# Patient Record
Sex: Female | Born: 2000 | Hispanic: Yes | Marital: Single | Smoking: Never smoker
Health system: Southern US, Community
[De-identification: ages and names within clinical notes are randomized; demographics above are authoritative.]

## PROBLEM LIST (undated history)

## (undated) DIAGNOSIS — Z789 Other specified health status: Secondary | ICD-10-CM

## (undated) HISTORY — DX: Other specified health status: Z78.9

---
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
## 2022-08-18 DIAGNOSIS — R87610 Atypical squamous cells of undetermined significance on cytologic smear of cervix (ASC-US): Secondary | ICD-10-CM | POA: Insufficient documentation

## 2024-01-21 ENCOUNTER — Other Ambulatory Visit: Payer: Self-pay | Admitting: *Deleted

## 2024-01-21 ENCOUNTER — Encounter: Payer: Self-pay | Admitting: *Deleted

## 2024-01-22 ENCOUNTER — Ambulatory Visit: Payer: Self-pay | Admitting: *Deleted

## 2024-01-22 ENCOUNTER — Other Ambulatory Visit (INDEPENDENT_AMBULATORY_CARE_PROVIDER_SITE_OTHER): Payer: Self-pay

## 2024-01-22 VITALS — BP 113/77 | HR 65 | Wt 130.6 lb

## 2024-01-22 DIAGNOSIS — Z3A12 12 weeks gestation of pregnancy: Secondary | ICD-10-CM | POA: Diagnosis not present

## 2024-01-22 DIAGNOSIS — Z3401 Encounter for supervision of normal first pregnancy, first trimester: Secondary | ICD-10-CM | POA: Diagnosis not present

## 2024-01-22 DIAGNOSIS — O3680X0 Pregnancy with inconclusive fetal viability, not applicable or unspecified: Secondary | ICD-10-CM | POA: Diagnosis not present

## 2024-01-22 DIAGNOSIS — Z3480 Encounter for supervision of other normal pregnancy, unspecified trimester: Secondary | ICD-10-CM

## 2024-01-22 NOTE — Patient Instructions (Signed)

## 2024-01-22 NOTE — Progress Notes (Signed)
 New OB Intake  I connected with Katelyn Sawyer  on 01/22/24 at  9:15 AM EDT by In Person Visit and verified that I am speaking with the correct person using two identifiers. Nurse is located at [facility] and pt is located at [place].  I discussed the limitations, risks, security and privacy concerns of performing an evaluation and management service by telephone and the availability of in person appointments. I also discussed with the patient that there may be a patient responsible charge related to this service. The patient expressed understanding and agreed to proceed.  I explained I am completing New OB Intake today. We discussed EDD of 08/02/24 based on LMP of 10/27/23. Pt is G1P0. I reviewed her allergies, medications and Medical/Surgical/OB history.    Patient Active Problem List   Diagnosis Date Noted   Rhinitis, allergic 10/06/2014     Concerns addressed today  Delivery Plans Plans to deliver at [facility]. Discussed the nature of our practice with multiple providers including residents and students. Due to the size of the practice, the delivering provider may not be the same as those providing prenatal care.   Patient is interested in water birth.  MyChart/Babyscripts MyChart access verified. I explained pt will have some visits in office and some virtually. Babyscripts instructions given and order placed. Patient verifies receipt of registration text/e-mail. Account successfully created and app downloaded. If patient is a candidate for Optimized scheduling, add to sticky note.   Blood Pressure Cuff/Weight Scale Pt has BP cuff at home. Explained after first prenatal appt pt will check weekly and document in Babyscripts. Patient does not have weight scale; patient may purchase if they desire to track weight weekly in Babyscripts.  Anatomy US  Explained first scheduled US  will be around 19 weeks. Anatomy US  scheduled for TBD at TBD.  Is patient a candidate for Babyscripts  Optimization? Yes, patient accepted    First visit review I reviewed new OB appt with patient. Explained pt will be seen by Jorene Moats, PA at first visit. Discussed [place] genetic screening with patient. Requests Panorama and Horizon.. Routine prenatal labs collected at today's visit.   Last Pap No results found for: DIAGPAP  Rocky CHRISTELLA Ober, RN 01/22/2024  9:30 AM

## 2024-01-23 LAB — CBC/D/PLT+RPR+RH+ABO+RUBIGG...
Antibody Screen: NEGATIVE
Basophils Absolute: 0.1 x10E3/uL (ref 0.0–0.2)
Basos: 1 %
EOS (ABSOLUTE): 0.1 x10E3/uL (ref 0.0–0.4)
Eos: 1 %
HCV Ab: NONREACTIVE
HIV Screen 4th Generation wRfx: NONREACTIVE
Hematocrit: 43 % (ref 34.0–46.6)
Hemoglobin: 14.1 g/dL (ref 11.1–15.9)
Hepatitis B Surface Ag: NEGATIVE
Immature Grans (Abs): 0.1 x10E3/uL (ref 0.0–0.1)
Immature Granulocytes: 1 %
Lymphocytes Absolute: 1.6 x10E3/uL (ref 0.7–3.1)
Lymphs: 22 %
MCH: 31.1 pg (ref 26.6–33.0)
MCHC: 32.8 g/dL (ref 31.5–35.7)
MCV: 95 fL (ref 79–97)
Monocytes Absolute: 0.4 x10E3/uL (ref 0.1–0.9)
Monocytes: 5 %
Neutrophils Absolute: 5.1 x10E3/uL (ref 1.4–7.0)
Neutrophils: 70 %
Platelets: 227 x10E3/uL (ref 150–450)
RBC: 4.54 x10E6/uL (ref 3.77–5.28)
RDW: 13.4 % (ref 11.7–15.4)
RPR Ser Ql: NONREACTIVE
Rh Factor: POSITIVE
Rubella Antibodies, IGG: 1.03 {index} (ref >0.99)
WBC: 7.3 x10E3/uL (ref 3.4–10.8)

## 2024-01-23 LAB — HEMOGLOBIN A1C
Est. average glucose Bld gHb Est-mCnc: 100 mg/dL
Hgb A1c MFr Bld: 5.1 % (ref 4.8–5.6)

## 2024-01-23 LAB — HCV INTERPRETATION

## 2024-01-25 LAB — CULTURE, OB URINE

## 2024-01-25 LAB — URINE CULTURE, OB REFLEX

## 2024-01-27 LAB — PANORAMA PRENATAL TEST FULL PANEL:PANORAMA TEST PLUS 5 ADDITIONAL MICRODELETIONS: FETAL FRACTION: 11.1

## 2024-01-28 ENCOUNTER — Ambulatory Visit: Payer: Self-pay | Admitting: Obstetrics and Gynecology

## 2024-01-28 DIAGNOSIS — Z3480 Encounter for supervision of other normal pregnancy, unspecified trimester: Secondary | ICD-10-CM

## 2024-01-30 LAB — HORIZON CUSTOM: REPORT SUMMARY: NEGATIVE

## 2024-02-05 ENCOUNTER — Other Ambulatory Visit (HOSPITAL_COMMUNITY)
Admission: RE | Admit: 2024-02-05 | Discharge: 2024-02-05 | Disposition: A | Discharge status: Home / Self Care | Source: Ambulatory Visit | Attending: Physician Assistant | Admitting: Physician Assistant

## 2024-02-05 ENCOUNTER — Encounter: Payer: Self-pay | Admitting: Physician Assistant

## 2024-02-05 ENCOUNTER — Ambulatory Visit (INDEPENDENT_AMBULATORY_CARE_PROVIDER_SITE_OTHER): Admitting: Physician Assistant

## 2024-02-05 VITALS — BP 116/75 | HR 82 | Wt 135.6 lb

## 2024-02-05 DIAGNOSIS — Z3A14 14 weeks gestation of pregnancy: Secondary | ICD-10-CM

## 2024-02-05 DIAGNOSIS — Z3480 Encounter for supervision of other normal pregnancy, unspecified trimester: Secondary | ICD-10-CM | POA: Diagnosis not present

## 2024-02-05 DIAGNOSIS — Z1331 Encounter for screening for depression: Secondary | ICD-10-CM

## 2024-02-05 MED ORDER — VITAFOL GUMMIES 3.33-0.333-34.8 MG PO CHEW: 1.0000 | CHEWABLE_TABLET | Freq: Every day | ORAL | 5 refills | Status: AC

## 2024-02-05 NOTE — Progress Notes (Addendum)
   PRENATAL VISIT NOTE  Subjective:  Katelyn Sawyer is a 23 y.o. G1P0000 at [redacted]w[redacted]d being seen today for ongoing prenatal care.  She is currently monitored for the following issues for this low-risk pregnancy and has Rhinitis, allergic and Supervision of other normal pregnancy, antepartum on their problem list.  Patient reports no complaints.  Contractions: Irritability. Vag. Bleeding: None.  Movement: Present. Denies leaking of fluid.   The following portions of the patient's history were reviewed and updated as appropriate: allergies, current medications, past family history, past medical history, past social history, past surgical history and problem list.   Objective:    Vitals:   02/05/24 0926  BP: 116/75  Pulse: 82  Weight: 135 lb 9.6 oz (61.5 kg)    Fetal Status:  Fetal Heart Rate (bpm): 140   Movement: Present    General: Alert, oriented and cooperative. Patient is in no acute distress.  Skin: Skin is warm and dry. No rash noted.   Cardiovascular: Normal heart rate noted  Respiratory: Normal respiratory effort, no problems with respiration noted  Abdomen: Soft, gravid, appropriate for gestational age.  Pain/Pressure: Present     Pelvic: Cervical exam deferred        Extremities: Normal range of motion.  Edema: Trace  Mental Status: Normal mood and affect. Normal behavior. Normal judgment and thought content.   Assessment and Plan:  Pregnancy: G1P0000 at [redacted]w[redacted]d  1. Supervision of other normal pregnancy, antepartum (Primary) Initial labs drawn. Continue prenatal vitamins. Genetic Screening discussed: NIPS, carrier screening and AFP Ultrasound discussed; fetal anatomic survey: ordered Problem list reviewed and updated. Reviewed Brx optimized schedule, patient agreeable The nature of [facility] Faculty Practice with multiple MDs and other Advanced Practice Providers was explained to patient; also emphasized that residents, students are part of  our team. Routine obstetric precautions reviewed.   2. [redacted] weeks gestation of pregnancy Anticipatory guidance about next visits/weeks of pregnancy given.   3. Positive depression screening On no medication. No SI/HI or thoughts of self-harm. Open to discuss with [facility] BH. Shared [facility] center contact in AVS.  - Amb ref to Integrated Behavioral Health   Preterm labor symptoms and general obstetric precautions including but not limited to vaginal bleeding, contractions, leaking of fluid and fetal movement were reviewed in detail with the patient.  Please refer to After Visit Summary for other counseling recommendations.   No follow-ups on file.  No future appointments.   Orby Tangen E Yehia Mcbain, PA-C

## 2024-02-05 NOTE — Patient Instructions (Addendum)
 If you need urgent care for mental health, go to:   [facility]  Address: [address], [place], [place] [address] Phone: [phone] Hours: Open 24 hours with no appointment needed

## 2024-02-05 NOTE — Progress Notes (Signed)
 PHQ is 10; Gad is 5; pt made aware of referral and agreed to it.   Pt declines PAP today.

## 2024-02-06 LAB — CERVICOVAGINAL ANCILLARY ONLY
Chlamydia: NEGATIVE
Comment: NEGATIVE
Comment: NEGATIVE
Comment: NORMAL
Neisseria Gonorrhea: NEGATIVE
Trichomonas: NEGATIVE

## 2024-02-09 ENCOUNTER — Ambulatory Visit: Payer: Self-pay | Admitting: Physician Assistant

## 2024-03-04 ENCOUNTER — Ambulatory Visit (INDEPENDENT_AMBULATORY_CARE_PROVIDER_SITE_OTHER): Admitting: Physician Assistant

## 2024-03-04 VITALS — BP 110/62 | HR 80 | Wt 136.4 lb

## 2024-03-04 DIAGNOSIS — Z3A18 18 weeks gestation of pregnancy: Secondary | ICD-10-CM | POA: Diagnosis not present

## 2024-03-04 DIAGNOSIS — Z3480 Encounter for supervision of other normal pregnancy, unspecified trimester: Secondary | ICD-10-CM

## 2024-03-04 NOTE — Progress Notes (Signed)
 Pt presents for rob. Pt has no questions or concerns at this time.

## 2024-03-04 NOTE — Progress Notes (Signed)
   PRENATAL VISIT NOTE  Subjective:  Katelyn Sawyer is a 23 y.o. G1P0000 at [redacted]w[redacted]d being seen today for ongoing prenatal care.  She is currently monitored for the following issues for this low-risk pregnancy and has Rhinitis, allergic and Supervision of other normal pregnancy, antepartum on their problem list.  Patient reports no complaints.  Contractions: Not present. Vag. Bleeding: None.  Movement: Absent. Denies leaking of fluid.   The following portions of the patient's history were reviewed and updated as appropriate: allergies, current medications, past family history, past medical history, past social history, past surgical history and problem list.   Objective:    Vitals:   03/04/24 1026  BP: 110/62  Pulse: 80  Weight: 136 lb 6.4 oz (61.9 kg)    Fetal Status:  Fetal Heart Rate (bpm): 140 Fundal Height: 19 cm Movement: Absent    General: Alert, oriented and cooperative. Patient is in no acute distress.  Skin: Skin is warm and dry. No rash noted.   Cardiovascular: Normal heart rate noted  Respiratory: Normal respiratory effort, no problems with respiration noted  Abdomen: Soft, gravid, appropriate for gestational age.  Pain/Pressure: Present     Pelvic: Cervical exam deferred        Extremities: Normal range of motion.  Edema: None  Mental Status: Normal mood and affect. Normal behavior. Normal judgment and thought content.   Assessment and Plan:  Pregnancy: G1P0000 at [redacted]w[redacted]d  1. Supervision of other normal pregnancy, antepartum (Primary) Patient doing well, feeling regular fetal movement  BP, FHR, FH appropriate   2. [redacted] weeks gestation of pregnancy Anticipatory guidance about next visits/weeks of pregnancy given.   Preterm labor symptoms and general obstetric precautions including but not limited to vaginal bleeding, contractions, leaking of fluid and fetal movement were reviewed in detail with the patient.  Please refer to After Visit Summary for other  counseling recommendations.   Return in about 4 weeks (around 04/01/2024) for LOB.  No future appointments.   Molli Gethers E Joia Doyle, PA-C

## 2024-03-06 LAB — AFP, SERUM, OPEN SPINA BIFIDA
AFP MoM: 1.43
AFP Value: 65.8 ng/mL
Gest. Age on Collection Date: 18 wk
Maternal Age At EDD: 23 a
OSBR Risk 1 IN: 3311
Test Results:: NEGATIVE
Weight: 136 [lb_av]

## 2024-03-11 ENCOUNTER — Ambulatory Visit: Payer: Self-pay | Admitting: Physician Assistant

## 2024-03-17 ENCOUNTER — Ambulatory Visit

## 2024-03-17 ENCOUNTER — Other Ambulatory Visit (HOSPITAL_COMMUNITY)
Admission: RE | Admit: 2024-03-17 | Discharge: 2024-03-17 | Disposition: A | Discharge status: Home / Self Care | Source: Ambulatory Visit | Attending: Obstetrics and Gynecology | Admitting: Obstetrics and Gynecology

## 2024-03-17 VITALS — BP 104/65 | HR 75

## 2024-03-17 DIAGNOSIS — R3915 Urgency of urination: Secondary | ICD-10-CM

## 2024-03-17 DIAGNOSIS — N898 Other specified noninflammatory disorders of vagina: Secondary | ICD-10-CM

## 2024-03-17 LAB — POCT URINALYSIS DIPSTICK
Bilirubin, UA: NEGATIVE
Blood, UA: NEGATIVE
Glucose, UA: NEGATIVE
Ketones, UA: NEGATIVE
Leukocytes, UA: NEGATIVE
Nitrite, UA: NEGATIVE
Protein, UA: POSITIVE — AB
Spec Grav, UA: 1.02 (ref 1.010–1.025)
Urobilinogen, UA: 0.2 U/dL
pH, UA: 6.5 (ref 5.0–8.0)

## 2024-03-17 MED ORDER — NITROFURANTOIN MONOHYD MACRO 100 MG PO CAPS: 100.0000 mg | ORAL_CAPSULE | Freq: Two times a day (BID) | ORAL | 0 refills | Status: DC

## 2024-03-17 NOTE — Progress Notes (Signed)
 SUBJECTIVE: Katelyn Sawyer is a 23 y.o. female who complains of urinary urgency OSD <1 week, without flank pain, fever, chills, or bleeding. Pt reports feeling the urge to urinate but then is unable to go. She also reports creamy Maylani Embree discharge. She was previously experiencing some itching but that has since resolved.   OBJECTIVE: Appears well, in no apparent distress.  Vital signs are normal. Urine dipstick shows positive for protein.    ASSESSMENT: Urinary urgency Vaginal Discharge  PLAN: Treatment per protocol. Urine culture sent to lab. Self swab obtained related to vaginal discharge. Call or return to clinic prn if these symptoms worsen or fail to improve as anticipated.

## 2024-03-18 LAB — CERVICOVAGINAL ANCILLARY ONLY
Bacterial Vaginitis (gardnerella): NEGATIVE
Candida Glabrata: NEGATIVE
Candida Vaginitis: NEGATIVE
Chlamydia: NEGATIVE
Comment: NEGATIVE
Comment: NEGATIVE
Comment: NEGATIVE
Comment: NEGATIVE
Comment: NEGATIVE
Comment: NORMAL
Neisseria Gonorrhea: NEGATIVE
Trichomonas: NEGATIVE

## 2024-03-21 LAB — URINE CULTURE

## 2024-04-01 ENCOUNTER — Encounter: Payer: Self-pay | Admitting: Physician Assistant

## 2024-04-01 ENCOUNTER — Ambulatory Visit (INDEPENDENT_AMBULATORY_CARE_PROVIDER_SITE_OTHER): Admitting: Physician Assistant

## 2024-04-01 VITALS — BP 115/70 | HR 94 | Wt 146.0 lb

## 2024-04-01 DIAGNOSIS — Z3A22 22 weeks gestation of pregnancy: Secondary | ICD-10-CM

## 2024-04-01 DIAGNOSIS — K30 Functional dyspepsia: Secondary | ICD-10-CM | POA: Diagnosis not present

## 2024-04-01 DIAGNOSIS — Z3480 Encounter for supervision of other normal pregnancy, unspecified trimester: Secondary | ICD-10-CM | POA: Diagnosis not present

## 2024-04-01 NOTE — Progress Notes (Signed)
   PRENATAL VISIT NOTE  Subjective:  Katelyn Sawyer is a 23 y.o. G1P0000 at [redacted]w[redacted]d being seen today for ongoing prenatal care.  She is currently monitored for the following issues for this low-risk pregnancy and has Supervision of other normal pregnancy, antepartum on their problem list.  Patient reports stabbing pain in upper middle abdomen that began earlier today. No fever, n/v, diarrhea, constipation.   Contractions: Irritability. Vag. Bleeding: None.  Movement: Present. Denies leaking of fluid.   The following portions of the patient's history were reviewed and updated as appropriate: allergies, current medications, past family history, past medical history, past social history, past surgical history and problem list.   Objective:    Vitals:   04/01/24 1354  BP: 115/70  Pulse: 94  Weight: 146 lb (66.2 kg)    Fetal Status:  Fetal Heart Rate (bpm): 145 Fundal Height: 22 cm Movement: Present    General: Alert, oriented and cooperative. Patient is in no acute distress.  Skin: Skin is warm and dry. No rash noted.   Cardiovascular: Normal heart rate noted  Respiratory: Normal respiratory effort, no problems with respiration noted  Abdomen: Soft, gravid, appropriate for gestational age.  Pain/Pressure: Present     Pelvic: Cervical exam deferred        Extremities: Normal range of motion.  Edema: None  Mental Status: Normal mood and affect. Normal behavior. Normal judgment and thought content.   Assessment and Plan:  Pregnancy: G1P0000 at [redacted]w[redacted]d  1. Supervision of other normal pregnancy, antepartum (Primary) Patient doing well, feeling regular fetal movement  BP, FHR, FH appropriate   2. [redacted] weeks gestation of pregnancy Anticipatory guidance about next visits/weeks of pregnancy given.   3. Indigestion Patient amenable to Pepcid trial OTC. Precautions given regarding presenting for emergency care.    Preterm labor symptoms and general obstetric precautions including  but not limited to vaginal bleeding, contractions, leaking of fluid and fetal movement were reviewed in detail with the patient.  Please refer to After Visit Summary for other counseling recommendations.   No follow-ups on file.  Future Appointments  Date Time Provider Department Center  04/10/2024  2:00 PM [facility] PROVIDER 1 [facility]  04/10/2024  2:30 PM [facility] US4 [facility]  04/29/2024  8:55 AM Latriece Anstine E, PA-C [facility] None    Abran Gavigan E Debora Stockdale, PA-C

## 2024-04-01 NOTE — Progress Notes (Signed)
 Pt states she just began having some stomach pain today that is concerning to her.   Pt states she never took the Microbid that was prescribed on 03/17/24  Urine was positive for protein on 9/9 labs.

## 2024-04-01 NOTE — Patient Instructions (Addendum)
 Foods that may cause indigestion or heartburn: -Fatty and fried foods, which linger longer in the stomach, making it more likely that stomach acid leaks back up into the esophagus, causing uncomfortable GERD symptoms. -Spicy foods, citrus, tomato sauces, and vinegar, which may intensify heartburn. -Chocolate, caffeine, onions, peppermint, carbonated drinks, and alcohol, which are all common heartburn triggers.  If you eat any of these foods regularly, try eliminating them to see if doing so reduces your reflux. Then you can try adding them back one by one.

## 2024-04-10 ENCOUNTER — Ambulatory Visit: Attending: Obstetrics and Gynecology | Admitting: Obstetrics

## 2024-04-10 ENCOUNTER — Ambulatory Visit (HOSPITAL_BASED_OUTPATIENT_CLINIC_OR_DEPARTMENT_OTHER)

## 2024-04-10 ENCOUNTER — Other Ambulatory Visit: Payer: Self-pay | Admitting: *Deleted

## 2024-04-10 VITALS — BP 111/61 | HR 80

## 2024-04-10 DIAGNOSIS — Z3689 Encounter for other specified antenatal screening: Secondary | ICD-10-CM | POA: Diagnosis not present

## 2024-04-10 DIAGNOSIS — Z3A23 23 weeks gestation of pregnancy: Secondary | ICD-10-CM | POA: Diagnosis not present

## 2024-04-10 DIAGNOSIS — Z363 Encounter for antenatal screening for malformations: Secondary | ICD-10-CM | POA: Diagnosis present

## 2024-04-10 DIAGNOSIS — Z3480 Encounter for supervision of other normal pregnancy, unspecified trimester: Secondary | ICD-10-CM

## 2024-04-10 DIAGNOSIS — O358XX0 Maternal care for other (suspected) fetal abnormality and damage, not applicable or unspecified: Secondary | ICD-10-CM

## 2024-04-10 DIAGNOSIS — Z3492 Encounter for supervision of normal pregnancy, unspecified, second trimester: Secondary | ICD-10-CM

## 2024-04-10 NOTE — Progress Notes (Signed)
 MFM Consult Note  Katelyn Sawyer is currently at 23 weeks and 5 days.  She was seen for detailed fetal anatomy scan.  She denies any significant past medical history and denies any problems in her current pregnancy.    She had a cell free DNA test earlier in her pregnancy which indicated a low risk for trisomy 22, 67, and 13. A female fetus is predicted.   Sonographic findings Single intrauterine pregnancy at 23w 3d. Fetal cardiac activity:  Observed and appears normal. Presentation: Cephalic. The anatomic structures that were well seen appear normal without evidence of soft markers. The anatomic survey is complete.  Fetal biometry shows the estimated fetal weight at the 44th percentile. Amniotic fluid: Within normal limits.  MVP: 5.78 cm. Placenta: Anterior. Adnexa: No abnormality visualized. Cervical length: 4.6 cm.  Based on the fetal biometry measurements obtained today, her EDC was changed to August 04, 2024, making her 23 weeks and 3 days pregnant today.    The [facility] of August 04, 2024 is consistent with a first trimester ultrasound performed in your office.  The patient was informed that anomalies may be missed due to technical limitations. If the fetus is in a suboptimal position or maternal habitus is increased, visualization of the fetus in the maternal uterus may be impaired.  A follow-up exam was scheduled in 6 weeks to confirm her dates.    The patient stated that all of her questions were answered today.  A total of 30 minutes was spent counseling and coordinating the care for this patient.  Greater than 50% of the time was spent in direct face-to-face contact.

## 2024-04-29 ENCOUNTER — Encounter: Admitting: Physician Assistant

## 2024-05-05 ENCOUNTER — Ambulatory Visit (INDEPENDENT_AMBULATORY_CARE_PROVIDER_SITE_OTHER): Admitting: Obstetrics and Gynecology

## 2024-05-05 VITALS — BP 120/71 | HR 67 | Wt 153.0 lb

## 2024-05-05 DIAGNOSIS — Z3A27 27 weeks gestation of pregnancy: Secondary | ICD-10-CM | POA: Diagnosis not present

## 2024-05-05 DIAGNOSIS — Z23 Encounter for immunization: Secondary | ICD-10-CM | POA: Diagnosis not present

## 2024-05-05 DIAGNOSIS — Z3480 Encounter for supervision of other normal pregnancy, unspecified trimester: Secondary | ICD-10-CM | POA: Diagnosis not present

## 2024-05-05 NOTE — Progress Notes (Signed)
   PRENATAL VISIT NOTE  Subjective:  Katelyn Sawyer is a 23 y.o. G1P0000 at [redacted]w[redacted]d being seen today for ongoing prenatal care.  She is currently monitored for the following issues for this low-risk pregnancy and has Supervision of other normal pregnancy, antepartum on their problem list.  Patient doing well with no acute concerns today. She reports no complaints.  Contractions: Not present. Vag. Bleeding: None.  Movement: Present. Denies leaking of fluid.   The following portions of the patient's history were reviewed and updated as appropriate: allergies, current medications, past family history, past medical history, past social history, past surgical history and problem list. Problem list updated.  Objective:   Vitals:   05/05/24 1117  BP: 120/71  Pulse: 67  Weight: 153 lb (69.4 kg)    Fetal Status: Fetal Heart Rate (bpm): 138 Fundal Height: 28 cm Movement: Present     General:  Alert, oriented and cooperative. Patient is in no acute distress.  Skin: Skin is warm and dry. No rash noted.   Cardiovascular: Normal heart rate noted  Respiratory: Normal respiratory effort, no problems with respiration noted  Abdomen: Soft, gravid, appropriate for gestational age.  Pain/Pressure: Absent     Pelvic: Cervical exam deferred        Extremities: Normal range of motion.  Edema: None  Mental Status:  Normal mood and affect. Normal behavior. Normal judgment and thought content.   Assessment and Plan:  Pregnancy: G1P0000 at [redacted]w[redacted]d  1. Supervision of other normal pregnancy, antepartum (Primary) Continue routine prenatal care Reviewed immunization record, no current flu shot, will offer at next visit - Tdap vaccine greater than or equal to 7yo IM  2. [redacted] weeks gestation of pregnancy   Preterm labor symptoms and general obstetric precautions including but not limited to vaginal bleeding, contractions, leaking of fluid and fetal movement were reviewed in detail with the  patient.  Please refer to After Visit Summary for other counseling recommendations.   Return in about 2 weeks (around 05/19/2024) for ROB, in person, 2 hr GTT, 3rd trim labs.   Jerilynn Buddle, MD Faculty Attending Center for [facility]

## 2024-05-15 ENCOUNTER — Ambulatory Visit

## 2024-05-19 ENCOUNTER — Ambulatory Visit: Admitting: Obstetrics and Gynecology

## 2024-05-19 ENCOUNTER — Other Ambulatory Visit

## 2024-05-19 VITALS — BP 107/70 | HR 80 | Wt 154.0 lb

## 2024-05-19 DIAGNOSIS — Z23 Encounter for immunization: Secondary | ICD-10-CM

## 2024-05-19 DIAGNOSIS — Z3A29 29 weeks gestation of pregnancy: Secondary | ICD-10-CM

## 2024-05-19 DIAGNOSIS — Z3480 Encounter for supervision of other normal pregnancy, unspecified trimester: Secondary | ICD-10-CM

## 2024-05-19 DIAGNOSIS — Z3401 Encounter for supervision of normal first pregnancy, first trimester: Secondary | ICD-10-CM | POA: Diagnosis not present

## 2024-05-19 NOTE — Progress Notes (Signed)
   PRENATAL VISIT NOTE  Subjective:  Katelyn Sawyer is a 23 y.o. G1P0000 at [redacted]w[redacted]d being seen today for ongoing prenatal care.  She is currently monitored for the following issues for this low-risk pregnancy and has Supervision of other normal pregnancy, antepartum on their problem list.  Patient doing well with no acute concerns today. She reports no complaints.  Contractions: Irritability. Vag. Bleeding: None.  Movement: Present. Denies leaking of fluid.   The following portions of the patient's history were reviewed and updated as appropriate: allergies, current medications, past family history, past medical history, past social history, past surgical history and problem list. Problem list updated.  Objective:   Vitals:   05/19/24 1009  BP: 107/70  Pulse: 80  Weight: 154 lb (69.9 kg)    Fetal Status: Fetal Heart Rate (bpm): 140 Fundal Height: 29 cm Movement: Present     General:  Alert, oriented and cooperative. Patient is in no acute distress.  Skin: Skin is warm and dry. No rash noted.   Cardiovascular: Normal heart rate noted  Respiratory: Normal respiratory effort, no problems with respiration noted  Abdomen: Soft, gravid, appropriate for gestational age.  Pain/Pressure: Absent     Pelvic: Cervical exam deferred        Extremities: Normal range of motion.  Edema: None  Mental Status:  Normal mood and affect. Normal behavior. Normal judgment and thought content.   Assessment and Plan:  Pregnancy: G1P0000 at [redacted]w[redacted]d  1. Supervision of other normal pregnancy, antepartum (Primary) Continue routine prenatal care  - Glucose Tolerance, 2 Hours w/1 Hour - RPR - CBC - HIV antibody (with reflex) - Flu vaccine trivalent PF, 6mos and older(Flulaval,Afluria,Fluarix,Fluzone)  2. [redacted] weeks gestation of pregnancy  - Glucose Tolerance, 2 Hours w/1 Hour - RPR - CBC - HIV antibody (with reflex) - Flu vaccine trivalent PF, 6mos and  older(Flulaval,Afluria,Fluarix,Fluzone)  Preterm labor symptoms and general obstetric precautions including but not limited to vaginal bleeding, contractions, leaking of fluid and fetal movement were reviewed in detail with the patient.  Please refer to After Visit Summary for other counseling recommendations.   Return in about 2 weeks (around 06/02/2024) for LOB, in person.   Jerilynn Buddle, MD Faculty Attending Center for [facility]

## 2024-05-20 LAB — CBC
Hematocrit: 36.9 % (ref 34.0–46.6)
Hemoglobin: 12.2 g/dL (ref 11.1–15.9)
MCH: 31 pg (ref 26.6–33.0)
MCHC: 33.1 g/dL (ref 31.5–35.7)
MCV: 94 fL (ref 79–97)
Platelets: 211 x10E3/uL (ref 150–450)
RBC: 3.94 x10E6/uL (ref 3.77–5.28)
RDW: 12.8 % (ref 11.7–15.4)
WBC: 7.8 x10E3/uL (ref 3.4–10.8)

## 2024-05-20 LAB — GLUCOSE TOLERANCE, 2 HOURS W/ 1HR
Glucose, 1 hour: 119 mg/dL (ref 70–179)
Glucose, 2 hour: 84 mg/dL (ref 70–152)
Glucose, Fasting: 72 mg/dL (ref 70–91)

## 2024-05-20 LAB — RPR: RPR Ser Ql: NONREACTIVE

## 2024-05-20 LAB — HIV ANTIBODY (ROUTINE TESTING W REFLEX): HIV Screen 4th Generation wRfx: NONREACTIVE

## 2024-05-27 ENCOUNTER — Ambulatory Visit: Payer: Self-pay | Admitting: Obstetrics and Gynecology

## 2024-06-02 ENCOUNTER — Encounter: Admitting: Obstetrics

## 2024-06-11 ENCOUNTER — Ambulatory Visit: Admitting: Obstetrics and Gynecology

## 2024-06-11 VITALS — BP 118/68 | HR 75 | Wt 160.1 lb

## 2024-06-11 DIAGNOSIS — Z3A32 32 weeks gestation of pregnancy: Secondary | ICD-10-CM

## 2024-06-11 DIAGNOSIS — Z3480 Encounter for supervision of other normal pregnancy, unspecified trimester: Secondary | ICD-10-CM

## 2024-06-11 DIAGNOSIS — Z3403 Encounter for supervision of normal first pregnancy, third trimester: Secondary | ICD-10-CM

## 2024-06-11 NOTE — Progress Notes (Signed)
 Denies any specific concerns. States she changed her mind about doing water birth classes.

## 2024-06-11 NOTE — Progress Notes (Signed)
   PRENATAL VISIT NOTE  Subjective:  Katelyn Sawyer is a 23 y.o. G1P0000 at [redacted]w[redacted]d being seen today for ongoing prenatal care.  She is currently monitored for the following issues for this low-risk pregnancy and has Supervision of other normal pregnancy, antepartum and Atypical squamous cells of undetermined significance (ASCUS) on Papanicolaou smear of cervix on their problem list.  Patient reports no complaints.  Contractions: Not present. Vag. Bleeding: None.  Movement: Present. Denies leaking of fluid.   The following portions of the patient's history were reviewed and updated as appropriate: allergies, current medications, past family history, past medical history, past social history, past surgical history and problem list.   Objective:   Vitals:   06/11/24 0906  BP: 118/68  Pulse: 75  Weight: 160 lb 1.6 oz (72.6 kg)    Fetal Status: Fetal Heart Rate (bpm): 139 Fundal Height: 31 cm Movement: Present     General:  Alert, oriented and cooperative. Patient is in no acute distress.  Skin: Skin is warm and dry. No rash noted.   Cardiovascular: Normal heart rate noted  Respiratory: Normal respiratory effort, no problems with respiration noted  Abdomen: Soft, gravid, appropriate for gestational age.  Pain/Pressure: Present      Assessment and Plan:  Pregnancy: G1P0000 at [redacted]w[redacted]d 1. Supervision of other normal pregnancy, antepartum (Primary) 2. [redacted] weeks gestation of pregnancy Peds list given Discussed RSV vaccine, not in stock this week, plan to admin next visit  Please refer to After Visit Summary for other counseling recommendations.   Return in about 4 weeks (around 07/09/2024) for return OB at 34 weeks with APP.  Kieth JAYSON Carolin, MD

## 2024-06-11 NOTE — Patient Instructions (Signed)

## 2024-06-18 ENCOUNTER — Ambulatory Visit: Attending: Obstetrics and Gynecology

## 2024-06-18 ENCOUNTER — Ambulatory Visit: Admitting: Maternal & Fetal Medicine

## 2024-06-18 VITALS — BP 120/67 | HR 74

## 2024-06-18 DIAGNOSIS — Z3493 Encounter for supervision of normal pregnancy, unspecified, third trimester: Secondary | ICD-10-CM

## 2024-06-18 DIAGNOSIS — Z3480 Encounter for supervision of other normal pregnancy, unspecified trimester: Secondary | ICD-10-CM

## 2024-06-18 DIAGNOSIS — Z3A33 33 weeks gestation of pregnancy: Secondary | ICD-10-CM

## 2024-06-18 DIAGNOSIS — Z3492 Encounter for supervision of normal pregnancy, unspecified, second trimester: Secondary | ICD-10-CM | POA: Insufficient documentation

## 2024-06-18 NOTE — Progress Notes (Signed)
° °  Patient information  Patient Name: Katelyn Sawyer  Patient MRN:   [id number]  Referring practice: [facility] Referring Provider: [facility]  Problem List   Patient Active Problem List   Diagnosis Date Noted   Supervision of other normal pregnancy, antepartum 01/22/2024   Atypical squamous cells of undetermined significance (ASCUS) on Papanicolaou smear of cervix 08/18/2022    Maternal Fetal medicine Consult  [facility] Katelyn Sawyer is a 23 y.o. G1P0000 at [redacted]w[redacted]d here for ultrasound and consultation. Katelyn Sawyer is doing well today with no acute concerns. Today we focused on the following:   Patient presents for a follow-up fetal growth ultrasound to confirm the estimated due date. Fetal growth is at the 20th percentile overall. The patient reports good fetal movement and has no current concerns. There are no additional indications for further ultrasounds at this time, and she may continue routine care with her OB provider. Referral back to MFM is appropriate if any new concerns arise.  Recommendations: -Continue routine prenatal care -Fetal movement precautions -No further ultrasounds needed unless new indications arise -Refer back to MFM with any concerns  Standard OB precautions were given to the patient. The patient had time to ask questions that were answered to her satisfaction.  She verbalized understanding and agrees to proceed with the plan above.   I spent 10 minutes reviewing the patients chart, including labs and images as well as counseling the patient about her medical conditions. Greater than 50% of the time was spent in direct face-to-face patient counseling.  Katelyn Sawyer  MFM, [place]   06/18/2024  11:29 AM   Review of Systems: A review of systems was performed and was negative except per HPI   Vitals and Physical Exam    06/18/2024    7:15 AM 06/11/2024    9:06 AM 05/19/2024   10:09 AM  Vitals with BMI  Weight  160 lbs 2  oz 154 lbs  Systolic 120 118 892  Diastolic 67 68 70  Pulse 74 75 80    Sitting comfortably on the sonogram table Nonlabored breathing Normal rate and rhythm Abdomen is nontender  Past pregnancies OB History  Gravida Para Term Preterm AB Living  1 0 0 0 0 0  SAB IAB Ectopic Multiple Live Births  0 0 0 0 0    # Outcome Date GA Lbr Len/2nd Weight Sex Type Anes PTL Lv  1 Current              Future Appointments  Date Time Provider Department Center  07/13/2024  8:55 AM Katelyn Nidia CROME, FNP [facility] None

## 2024-07-09 NOTE — L&D Delivery Note (Signed)
 [facility]N [facility] Delivery Note  Katelyn Sawyer is a 24 y.o. G1P0000 s/p NVD at [redacted]w[redacted]d. She was admitted for SROM.  ROM: 35h 45m with clear fluid GBS Status: Negative/-- (01/05 0935)  Maximum Maternal Temperature: 98.8  Labor Progress: Initial SVE: 3/80/-3. AROM and Pitocin . She then progressed to complete.   Delivery Date/Time: 08/10/2024 11:11 PM Delivery: Called to room and patient was complete and pushing. Head delivered straight occiput anterior. nuchal cord absent. Shoulder and body delivered in usual fashion. Infant with spontaneous cry, placed on mother's abdomen, dried and stimulated. Cord clamped x 2 after 1-minute delay, and cut by father. Cord blood collected . Placenta delivered spontaneously with gentle cord traction. Fundus firm with massage and Pitocin . Labia, perineum, vagina, and cervix were inspected and repaired.  Placenta:  spontaneous Intact Placenta to L&D Complications:none Lacerations: 1st degree perineal, R vaginal sidewall and labial QBL: Analgesia: Epidural  Newborn Data:  Living status:Living Gender:Female Apgars:8 ,9  Weight:            Barabara Maier, DO [facility] [employer]

## 2024-07-13 ENCOUNTER — Other Ambulatory Visit (HOSPITAL_COMMUNITY)
Admission: RE | Admit: 2024-07-13 | Discharge: 2024-07-13 | Disposition: A | Discharge status: Home / Self Care | Source: Ambulatory Visit | Attending: Obstetrics and Gynecology | Admitting: Obstetrics and Gynecology

## 2024-07-13 ENCOUNTER — Encounter: Admitting: Obstetrics and Gynecology

## 2024-07-13 ENCOUNTER — Encounter: Payer: Self-pay | Admitting: Obstetrics and Gynecology

## 2024-07-13 VITALS — BP 126/69 | HR 74 | Wt 166.8 lb

## 2024-07-13 DIAGNOSIS — Z3483 Encounter for supervision of other normal pregnancy, third trimester: Secondary | ICD-10-CM | POA: Insufficient documentation

## 2024-07-13 DIAGNOSIS — Z3480 Encounter for supervision of other normal pregnancy, unspecified trimester: Secondary | ICD-10-CM

## 2024-07-13 DIAGNOSIS — Z3A37 37 weeks gestation of pregnancy: Secondary | ICD-10-CM | POA: Insufficient documentation

## 2024-07-13 NOTE — Patient Instructions (Addendum)
 [facility] Pediatric Providers  [place]outheast [place] ([address]) [facility] Delores, MD; Jeanelle, MD; Anders, MD; Scarlet, MD; McDiarmid, MD; Debborah, MD [address] [place], [place] [address] [phone] Mon-Fri 8:30-12:30, 1:30-5:00  Providers come to see babies during newborn hospitalization Only accepting infants of Mother's who are seen at [facility] or have siblings seen at   [facility] Medicaid - Yes; Tricare - Yes   [facility] [place], MD [address], [place], [place] [address] [phone] Mon, Tue, Thur, Fri 8:30-5:00, Wed 10:00-7:00 (closed 1-2pm daily for lunch) [facility] residents with no insurance.  [place] [employer] only with Medicaid/insurance; Tricare - no  [facility]) - Tim [facility], MD; Delores, MD; Dozier, MD; Artice, MD; Lorrene, MD; Kenney, MD; Azell, MD; Joshua,  MD; Gretel, MD; Leta, MD; Herminio, MD; Gabriella, MD; Taft, MD; Teodora, NP [address] [place], [place], [place] [address] [phone] Mon, Tue, Thur, Fri 8:30-5:30, Wed 9:30-5:30, Sat 8:30-12:30 Only accepting infants of first-time parents or siblings of current patients Hospital discharge coordinator will make follow-up appointment Medicaid - yes; Tricare - yes  [place] [phone]) [facility] Free, MD; Nicholaus, MD; Dennise, MD; Valdemar, MD; Marget, MD; [facility], MD; Hunter, MD; Arlys, MD; Trudy, MD [address], [place], [place] [address] [phone] Mon-Fri 8:30-5:00, closed for lunch 12:30-1:30; Sat-Sun 10:00-1:00 Accepting Newborns with commercial insurance only, must call prior to delivery to be accepted into  practice.  Medicaid - no, Tricare - yes   [facility] [address] [place], [place] [address] [phone] or [phone] Mon to Fri 8am to 10pm, Sat 8am to 1pm  (virtual only on weekends) Only accepts Medicaid Healthy Blue pts  [facility] ([facility]) - Pediatrics at [place] Florence, MD; Doreene, MD; Charlane Idol, MD; Davia, NP; Florie, MD; Audelia NELS Barrio, MD [address] [place] [place], [place] [address] [phone] Mon-Fri 8:30-5:30 Medicaid - yes, Tricare - yes  [place] [phone]) [facility] Donovan, MD [address], [place], [place] [address] [phone] Pablo Bodily, Wed Fri 8:30-5:00, Sat 8:30-12:00, Closed Thursdays Accepting siblings of established patients and first time mom's if you call prenatally Medicaid- yes; Tricare - yes  [facility] Signa [place], [place]; Rolinda, MD; Richarda RIGGERS; Scifres, PA; Austin, MD; Seabron, MD;  [address], [place], [place] [address] [phone] Mon-Fri 8:30-5:00, closed for lunch 1-2 Only accepting newborns of established patients Medicaid- no; Tricare - yes  [facility] [phone]) [facility] Herlene Rotunda, MD; [address], [place], [place] [address] [phone] Mon-Fri 8:00-5:00 Medicaid - No; Tricare - Yes  [facility], [place]; [place], [place] [address] [place], [place] [address] [phone] Mon-Fri 8:00-5:00 Medicaid - No, Tricare - Yes  [facility] Selma, MD; Ty, MD; [place], [place] [address], [address] [place], [place] [address] [phone]  Mon-Fri 8:00-5:00 Medicaid - No; Tricare - Yes  [facility] [address], [place], [place] [address] [phone] Mon-Fri 8:30-5:00 (lunch 12:00-1:00) Medicaid -Yes; Tricare - Yes   [facility] Jordan, MD [address] [place], [place], [place] [address] [phone] Mon-Fri 8:00-5:00 Seeing newborns of current patients only. No new patients Medicaid - No, Tricare - yes  Nature Conservation Officer at [place] [address], MD [address] [place], [place] [address] [phone] Mon-Fri 8:00-5:00 Medicaid -yes as secondary coverage only;  Tricare - yes  [facility] Rufus, [place]; [place], [place]; Sybil, MD; Zelpha, MD; Dozier, MD; [place], [place]; Smoot, NP; Madalyn, MD; Lavinia, MD [address] [place], [place] [address] [phone] Mon-Fri 8:30-5:00, Sat 9:00-11:00 Accepts commercial insurance ONLY. Offers free prenatal information sessions for families. Medicaid - No, Tricare - Call first  [facility] Innsbrook, MD; [place], [place]; [place], [place]; [place], [place] [address] [place] [address] [phone] Mon-Fri 7:30-5:30 Medicaid - Yes; Normie JASMINE yes  [place] [phone] & [phone])  [facility], MD [address], [place], [place] [address] [phone] Mon-Thur 8:00-6:00, closed for lunch 12-2, closed Fridays Medicaid - yes; Tricare - no  [facility] Lenon, NP; Sophronia, MD; [place], [place]; Justin, [place] [address], [place], [place] [address] [phone] Mon-Fri 7:30-4:30 Medicaid - yes, Tricare - yes  [facility]  Birdie, MD; Belenda, NP; Nicholas, MD; Donnamae, NP [address], [place], [place] [address] [phone] Mon-Fri 8:30-5:00, closed for lunch 1-2, Sat 8:30-12:00 - sick visits only Providers come to see babies at [facility] Only accepting newborns of siblings and first time parents ONLY if who have met with office prior to delivery Medicaid -Yes; Tricare - yes  [facility] - [place], [place]; [place], NP; Prentiss, MD; Debarah, MD:  [address], [place], [place] [address] [phone] Mon- Fri 8:00-5:00, Sat 9:00-12:00 - sick visits only Accepting siblings of established patients and first time mom/baby Medicaid - Yes; Tricare - yes Patients must have vaccinations (baby vaccines)  [place]west [place] [phone] &  [phone])  Adult Nurse HealthCare at [facility] [address] [place], [place] [address] [phone] Mon-Fri 8:00-5:00 Medicaid - no; Tricare - yes  [facility] [place], MD; [place], [place]; [place], [place] [address], [place], [place] [address] [phone] Mon-Fri 8:00-5:00 Medicaid- yes; Tricare - yes  [facility] - Myra Verita Brought, MD; Joshua, NP; [place], [place] [address] [place], [place], [place] [address] [phone] Mon-Fri 8:00-5:00 Medicaid - Yes; Tricare - yes  [address] [place] [phone])  [facility] [place], [place]; [place], [place]; Tommas, MD; Armond, MD; Wheeler, NP; Isenhour, DO; [place], [place]; Chandra, MD; Layla, MD; Orlando, MD; [place], [place]; [place], [place]; [place], [place] [address] [facility] [address], [place], [place] [address] [phone] Mon-Fri 8:30-5:00, Sat 9:00-12:00 - sick only Please register online [email or web address] then schedule online or call office Medicaid-Yes; Tricare -yes  [facility] - Premier  Dabrusco, MD; Joaquim, MD; [place]wood, MD; Nelson, NP; [place], [place]; Charlyne, MD; Shlomo, NP; Devora, MD [address], [place], [place] [address] [phone] Mon-Fri 8:00-5:30, Sat&Sun by appointment (phones open at 8:30) Medicaid - Yes; Tricare - yes  [place] [phone] & [phone]) [facility] Dasie FINER; [place] Chapel, MD; Vaughn, MD; Moishe, NP; Salix, DO [address], [place], [place] [address] [phone] M-F 8:00 - 5:15, Sat/Sun 9-12 sick visits only Medicaid - No; Tricare - yes  [facility]  Delcambre, PA-C; [place], PA-C; Mullen, DO; Napi Headquarters, PA-C; [place], PA-C; Theo NELS [place], MD [address], [place], [place] [address] [phone] Mon-Thur 8:00-7:00, Fri 8:00-5:00 Accepting Medicaid for 13 and under only   [facility] - Family Medicine  at [address] [facility] [place]) [place], [place]; List, FNP; Myrick, MD; Pitonzo, PA-C; Scholer,  MD; Kathy, FNP; Nzenwa, FNP; Montgomery, MD; Myrick, MD [phone] [address], [place], [place] [address] [phone] Mon-Fri 8:30-5:30 Medicaid - Yes; Tricare - yes  [facility]s - [address]  [place], Barry; Leontine, MD; Atilano, MD; Dessa, MD; [place], NP [address], [place], [place] [address] [phone] Mon-Thur 8:00-5:30, Fri 8:00-5:00, Sat 9:00-12:00 Medicaid - yes, Tricare - yes  [place] [phone])  [facility], [place]; Nanci, MD; [place], [place] [address], [place], [place] [address] [phone] Mon-Fri 8:00-5:00, closed for lunch 12-1 Medicaid - No; Tricare - yes  Nature Conservation Officer at [facility], MD [address] [place], [place] [address] [phone] Mon-Fri 8:00-5:00 Medicaid - No; Tricare - yes  [facility] - [facility], MD; Eber, MD; Livingston, MD; Joshua, MD [address], [place], [place] [address] [phone] Mon-Fri 8:00-5:00 Medicaid- Yes; Tricare - yes  [place] [phone])  Adult Nurse HealthCare at [facility], [place]; Duquesne, MD [address] [place], [place] [address] [phone] Mon-Fri 8:00-5:00 Medicaid - No; Tricare - yes  [facility] - Karenann Kin - CPNP [address], [place], [place] [address] [phone] Mon-Weds 8:00-6:00, Thurs-Fri 8:00-5:00, Sat 9:00-12:00 Medicaid - yes; Tricare - yes   [facility] Karenann Moores, MD; [place], [place] [address] [place], [place] [address] [phone] Mon-Fri 8:00-5:00 Medicaid - yes; Tricare - yes  [facility] Pediatric Providers  [facility] [address], [place], [place] [address] [phone] CHRISTELLA Cagey: 8am -8pm, Tues, Weds: 8am - 5pm; Fri: 8-1 Medicaid - Yes; Tricare -  yes  [facility] Azle Dariel, MD; Vicci, MD; Malvina, MD; [place], [place]; [place], [place] [address], [place], [place] [address] [phone] M-F 8:30 - 5:00 Medicaid - Call office; Tricare -yes  [facility] Devora Hash, MD; Jenelle, MD, Marny, MD; Gustabo, PNP; Wenceslao, NP [phone] [address], [place], [place] [address] [phone] M-F 8:30 - 5:00, Sat/Sun 8:30 - 12:30 (sick visits) Medicaid - Call office; Tricare -yes  [facility] Ezzard, MD; Bard, PNP; Enoch, MD; [place], [place]; [place], NP; Rozelle FINER [address], [place], [place] [address] [phone] M-F 8:30 - 5:00 Medicaid - Call office; Tricare - yes  [facility] Rozell Rosella, MD; Clarance, MD; Dionisio, MD; Nelda, MD; Nogo, MD [phone] [address], [place], [place] [address] [phone] M-Thur: 8:00 - 5:00; Fri: 8:00 - 4:00 Medicaid - yes; Tricare - yes  [facility] [address] [place], [place] [address] [phone] M-F: 8:30- 5:00, closed for lunch 12:30 - 1:00 Medicaid - yes; Tricare -yes  [facility] [address], [place], [place] [address] [phone] M-F 8:00 - 5:00 Medicaid - yes; Tricare - yes  [facility] Saucier, DO; Kearny, DO; Kennedy, NP [address] [place], [place] [address] [phone] M-F 8:00 - 5:00, Closed 12-1 for lunch Medicaid - Call; Tricare - yes  [facility] Loris, MD [address] [place], [place] [address] [phone] M-F: 8:00-5:00, Sat: 8:00 - noon Medicaid - call; Tricare -yes  [facility] Pediatric Providers  [facility] [place], [place] [address] [place], [place] [address] [phone] M-W: 8:00-5:00, Thur: 8:00 - 7:00, Fri: 8:00 - noon Medicaid - yes; Tricare - yes  [facility] Clause, FNP [address] [place],  [place] [address] [phone] M-F 8:00 - 5:00, Closed for lunch 12-1 Medicaid -  yes; Tricare - yes  [facility] Pediatric Providers  [facility] [place], [place], Seena, MD, [place], FNP-C [address] [facility], [address], [place], [place] [address] [phone] M-T 8:00-5:00, Wed-Fri 7:00-6:00 Medicaid - Yes; Tricare -yes  [facility], [place]; [address], [address] [place], [place] [address] [phone] M-F 8:00 - 5:00, closed for lunch 12-1 Medicaid - Yes; Tricare - yes  [facility] and Internal Medicine  Gwenn, MD; Relda, MD; Merleen, MD; Karn, MD; Charlyn, MD; Reeda, MD; Warren, MD, Elizabeth, MD; Krystal, MD; Simon, MD; Darral, MD; Debarah, MD [address], [place], [place] [address] [phone] M-F 8:00-5:00 Medicaid - yes; Tricare - yes  [facility], MD (speaks Punjabi and Hindi) [address] [place], [place] [address] [phone] M-F: 8:30 - 5:00, closed 12:30 - 1 for lunch Medicaid - Yes; Tricare -yes  [facility] Pediatric Providers  [facility] Delford, MD; Chrystal, MD; Dann, MD [address] [place], [place] [address] [phone] M-Th: 8:00 - 5:30, Fri: 8:00 - 12:00 Medicaid - yes; Tricare - yes  [facility] at [facility], NP; Gladis, MD; Dian, MD 510 727 [address] [place], [place] [address] [phone] M-F: 8:00 - 5:00 Medicaid - yes; Tricare - yes  [facility] Vanderbilt, NP; Effie, NP; Renato, NP; Driscilla, MD; Trudy, MD, [place], NP, Edsel, MD; Tobie HAS [address] [place], [place] [address] [phone] M-F: 8:30 - 5:30p Medicaid - yes; Tricare - yes Other locations available as well  [facility], MD; Tanda, MD; Joesph DEVONNA Archie NELS Billy, PA-C [address] [place], [place] [address] [phone] M-W: 8:00am - 7:00pm, Thurs: 8:00am - 8:00pm; Fri: 8:00am -  5:00pm, closed daily from 12-1 for lunch Medicaid - yes; Tricare - yes  [facility] Pediatric Providers  [facility]s at Lurlene Clause, MD; Camelia, FNP; Ruther, MD; Christiana, MD; Trumb[place], PNP; Nola DEVONNA; [place], [place]; Lonzell, MD;  [address], [place], [place] [address] [phone] CHRISTELLA - [place]: 8am - 5pm, Sat 9-noon Medicaid - Yes; Tricare -yes  [facility] at Graciela Chain, MD; Joshua, FNP; Silva, MD; Livingston, MD [address] [place] MARR[place], [id number] [phone] M-F 8:00 - 5:00 Medicaid - call; Tricare - yes  [facility]- Loraine Edison, MD; [place], [place]; Climmie, MD; Lenon, MD; Sharie NELS Louder, MD; Murphy, MD; Blane NODAL; Lenard, MD; Curly, MD; Swink, MD [address] [place], [place] [address] [phone] M-F 8:00am - 5:00pm; Sat. 9:00 - 11:00 Medicaid - yes; Tricare - yes  [facility] at [facility], MD [address] [place], [place] [address] [phone] M-F 8:00 - 5:00 Medicaid - [place] Medicaid only; Tricare - yes  [facility] - Mccoy Finder, MD; Nicholaus, [place]; Murphy, MD [address] [place], [place] [address] [phone] M-F 8:00 - 5:00 Medicaid - yes; Tricare - yes  [facility] [address] Pediatrics - Kennedy Dames, MD; Delores, MD, [facility], MD, Herricks, MD; [place], MD; Claudene, MD; [address] Christianna Fonder [place], [place] [address] [phone] M-F: 8-5 Medicaid - yes; Tricare - yes  [facility] - Valli [place]ola, [place]; [place], MD; [address], [place], [place] [address] [phone] M-F 8-5 Medicaid - yes; Tricare - yes  [address] [place] Micheline GLENWOOD Livingston, MD; Chain, MD;  Sold[place]-Courture, MD; Pellam-Palmer, DNP; [place], PNP [address], [place], [place] [address] [phone] M-F 8-5 Medicaid - yes; Tricare - yes  [facility] and Pediatrics Maureen, MD;  Gregorio RIGGERS; Karole Moots, MD [address] [place], [place] [address] [phone] M-F 7am - 5 pm Medicaid - call; Tricare - yes  [facility] [place], [place]; Rod, MD; Lang, MD [address] [place], [place] [address] [phone] M-F 8-5 Medicaid - yes; Tricare - yes  [facility] Marylouise, MD; Rory, MD; Trudy, FNP; Burnetta, FNP; Vida, FNP; Elena RIGGERS; [facility] - FNP [address], [place], [place] [address] [phone] M-F 8-5 Medicaid- yes; Tricare - yes  [facility] - Primus Pesa, Lively and Jeannetta Phenix, MD; Gaye, MD; Willistine, MD; Jackquline, MD; [place], [place]; Primus, MD; Rod, MD; Jeannetta, MD [address] [place], [place] [address] [phone] M-F: 8-5, Sat: 9-4, Sun 9-12 Medicaid - yes; Tricare - yes  [facility] Little, PNP; Nicholaus, PNP [address] Christianna Fonder [place], [place] [address] [phone] M-F 8 - 5, closed 12-1 for lunch Medicaid - yes; Tricare - yes  [facility]s Kellie, MD; Bradley, MD; Jeannetta, MD; Earling, DO [address] [place], [place] [address] [phone] M-F 8- 5:30 Medicaid - yes; Tricare - yes  [facility] Pediatrics - Valli Fielding, MD; Levander, MD; Regena, MD [address], [place], [place] [address] [phone] CHRISTELLA: [place]; Tues-Fri: 8-5; Sat: 9-12 Medicaid - yes; Tricare - yes  [facility] - Lurlene Pfeiffer, MD; Gretel, MD; Gretta, MD; Modesto, MD; Rawland, MD [address] [place], [place] [address] [phone] CHRISTELLABETHA MONROEMERL Norrie: 8-5; Sat: 8:30-12:30 Medicaid - yes; Tricare - yes  [facility] Pediatrics - Fonder Rhett Billing, MD; [place], [place] [address], [place], [place] [address] [phone] Mon-Fri: 8-5 Medicaid - yes;  Tricare - yes  [facility] Pediatrics - [place] [facility], CPNP; [place], [place]; Jeannetta, MD; Linton, MD; Ambrose, MD; [address] [place], [place] [address] [phone] M-F: 8-5, closed 1-2 for lunch Medicaid - yes; Tricare - yes  [facility] - [facility] [place], [place]; [place], [place]; Claudene, MD; Jordan, CPNP; [place], [place]; [place], MD; Prentiss, MD [address], [place], [place] [address] [phone] M-Thurs: [place]; Fri: 8-6; Sat: 9-12; Sun 2-4 Medicaid - yes; Tricare - yes  [facility] Alvin Daring, MD; Lupe, MD; Floy CHOL, FNP; Fronie, DO; [address], [place], [place] [address] [phone] M-F: 8-5 Medicaid - yes; Tricare - yes  [facility] Pediatric Providers  [facility] - Family Medicine -Sherryll Hobby, MD; Sinclair, NP [address], [place], [place] [address] [phone] M - Fri: 8am - 5pm, closed for lunch 12-1 Medicaid - Yes; Tricare - yes  [facility] Dea, MD; Carlo, MD; Sanger, DO; Vinocur, MD;Hall, PA; [place], [place]; Elaine, NP [phone] [address] [place], [place] [address] [phone] M-F 8:00 - 5:00, Sat 8:00 - 11:30 Medicaid - yes; Tricare - yes  [facility] Fernand, MD; Linwood, MD, [address] MD, Norge, MD, [place], MD; [place], NP; [place], [place];  [address] [place], [place] [address] [phone] M-F 8:10am - 5:00pm Medicaid - yes; Tricare - yes  [facility] [place], MD; [place], NP [address], [place], [place] [address] [phone] M-F 8:00 - 5:00 Medicaid - [place] Medicaid only; Tricare - yes  [facility] Family Medicine - Deep [address] [place], [place]; Pajonal, NP [address], [place], [place] [address] [phone] M-F 8:00 - 5:00; Closed for lunch 12 - 1:00 Medicaid - yes;  Tricare - yes  [facility] Gayl, MD; [place], FNP [address] [place], [place] [address] [phone] Mon 9-5; Tues/Wed 10-5; Thurs 8:30-5; Fri: 8-12:30 Medicaid - yes; Tricare - yes  [facility] Pediatric Providers  [facility]  [place], MD; [place], [place] [address] [place], [place] [address] [phone] phone [phone] fax M-F 7:15 - 4:30 Medicaid - yes; Tricare - yes  [facility] Caswell, MD; [place], DO [address], [place], [place] [address] [phone] M-Fri: 8:30 - 5:00, closed for lunch everyday noon - 1pm Medicaid - Yes; Tricare - yes  [facility] Burdine, MD; Toribio, MD; Kayla, MD; Atilano, MD; [place], [place]; Jolee NELS Don, [place]; [place], [place]; [place], [place]; [place], [place] [address] [place], [place] [address] [phone] M-Thurs: 7:30am - 7:00pm; Friday 7:30am - 4pm; Sat: 8:00 - 1:00 Medicaid - Yes; Tricare - yes  [facility] Moulding, MD; Rendell, MD; Lord, MD; [place], DO [address], [place], [place] [address] [phone] M-Thur: 8:00 - 5:00, Fri: 8:00 - Noon Medicaid - yes; Tricare - yes No [place]  [place] [facility] Dettinger, MD; Jolinda, DO; Geneva, NP; Gladis, NP; Joesph, NP; Cathlene, NP; Severa, NP; Zollie, MD; [place], [place] [address] [place], [place] [address] [phone] M-F 8:00 - 5:00 Medicaid - yes; Tricare - yes  [facility], FNP-C; Bucio, FNP-C [address] [place], [place] [address] [phone] M, W, R 8:00-5:00, Tues: 8:00am - 7:00pm; Fri 8:00 - noon Medicaid - Yes; Tricare - yes  [facility], MD [address] [place], [place] [address] [phone]  M-Thurs 8:30-5:30, Fri: 8:30-12:30pm Medicaid - Yes; Tricare - N Maternity Assessment Unit  [facility]   [address]ance [address]  [place], [place]

## 2024-07-13 NOTE — Progress Notes (Signed)
 "  PRENATAL VISIT NOTE  Subjective:  Katelyn Sawyer is a 24 y.o. G1P0000 at [redacted]w[redacted]d being seen today for ongoing prenatal care.  She is currently monitored for the following issues for this low-risk pregnancy and has Supervision of other normal pregnancy, antepartum and Atypical squamous cells of undetermined significance (ASCUS) on Papanicolaou smear of cervix on their problem list.  Patient reports no complaints.  Contractions: Not present. Vag. Bleeding: None.  Movement: Present. Denies leaking of fluid.   The following portions of the patient's history were reviewed and updated as appropriate: allergies, current medications, past family history, past medical history, past social history, past surgical history and problem list.   Objective:   Vitals:   07/13/24 0902  BP: 126/69  Pulse: 74  Weight: 166 lb 12.8 oz (75.7 kg)    Fetal Status:  Fetal Heart Rate (bpm): 131 Fundal Height: 37 cm Movement: Present Presentation: Vertex  General: Alert, oriented and cooperative. Patient is in no acute distress.  Skin: Skin is warm and dry. No rash noted.   Cardiovascular: Normal heart rate noted  Respiratory: Normal respiratory effort, no problems with respiration noted  Abdomen: Soft, gravid, appropriate for gestational age.  Pain/Pressure: Present     Pelvic: Cervical exam deferred        Extremities: Normal range of motion.  Edema: Trace  Mental Status: Normal mood and affect. Normal behavior. Normal judgment and thought content.      07/13/2024    9:04 AM 05/19/2024   10:06 AM 02/05/2024    9:41 AM  Depression screen PHQ 2/9  Decreased Interest 0 0 2  Down, Depressed, Hopeless 0 0 1  PHQ - 2 Score 0 0 3  Altered sleeping 1 0 2  Tired, decreased energy 1 0 3  Change in appetite 0 0 1  Feeling bad or failure about yourself  0 0 0  Trouble concentrating 0 0 1  Moving slowly or fidgety/restless 0 0 0  Suicidal thoughts 0 0 0  PHQ-9 Score 2 0 10   Difficult doing  work/chores  Not difficult at all      Data saved with a previous flowsheet row definition        07/13/2024    9:05 AM 05/19/2024   10:07 AM 02/05/2024    9:42 AM 01/22/2024    9:46 AM  GAD 7 : Generalized Anxiety Score  Nervous, Anxious, on Edge 0 0 1 1  Control/stop worrying 0 0 0 0  Worry too much - different things 0 0 1 0  Trouble relaxing 0 0 1 0  Restless 0 0 0 0  Easily annoyed or irritable 0 0 2 1  Afraid - awful might happen 0 0 0 0  Total GAD 7 Score 0 0 5 2  Anxiety Difficulty  Not difficult at all      Assessment and Plan:  Pregnancy: G1P0000 at [redacted]w[redacted]d 1. Supervision of other normal pregnancy, antepartum (Primary) BP and FHR normal Doing well, feeling regular movement    2. [redacted] weeks gestation of pregnancy GBS swab today  12/18 u/s normal growth  Peds list provided today Labor precautions discussed and delivery hospital location discussed   Term labor symptoms and general obstetric precautions including but not limited to vaginal bleeding, contractions, leaking of fluid and fetal movement were reviewed in detail with the patient. Please refer to After Visit Summary for other counseling recommendations.   Return in about 1 week (around 07/20/2024) for OB VISIT (MD  or APP).   Nidia Daring, FNP  "

## 2024-07-13 NOTE — Progress Notes (Signed)
 Pt presents for ROB visit. No concerns

## 2024-07-14 ENCOUNTER — Ambulatory Visit: Payer: Self-pay | Admitting: Obstetrics and Gynecology

## 2024-07-14 DIAGNOSIS — A749 Chlamydial infection, unspecified: Secondary | ICD-10-CM | POA: Insufficient documentation

## 2024-07-14 LAB — CERVICOVAGINAL ANCILLARY ONLY
Chlamydia: POSITIVE — AB
Comment: NEGATIVE
Comment: NEGATIVE
Comment: NORMAL
Neisseria Gonorrhea: NEGATIVE
Trichomonas: NEGATIVE

## 2024-07-14 MED ORDER — AZITHROMYCIN 250 MG PO TABS: 1000.0000 mg | ORAL_TABLET | Freq: Once | ORAL | 0 refills | Status: AC

## 2024-07-17 LAB — CULTURE, BETA STREP (GROUP B ONLY): Strep Gp B Culture: NEGATIVE

## 2024-07-18 ENCOUNTER — Inpatient Hospital Stay (HOSPITAL_COMMUNITY)
Admission: AD | Admit: 2024-07-18 | Discharge: 2024-07-18 | Disposition: A | Discharge status: Home / Self Care | Attending: Obstetrics and Gynecology | Admitting: Obstetrics and Gynecology

## 2024-07-18 ENCOUNTER — Encounter (HOSPITAL_COMMUNITY): Payer: Self-pay | Admitting: Obstetrics and Gynecology

## 2024-07-18 DIAGNOSIS — O368130 Decreased fetal movements, third trimester, not applicable or unspecified: Secondary | ICD-10-CM | POA: Diagnosis not present

## 2024-07-18 DIAGNOSIS — Z3A37 37 weeks gestation of pregnancy: Secondary | ICD-10-CM | POA: Diagnosis not present

## 2024-07-18 DIAGNOSIS — Z3689 Encounter for other specified antenatal screening: Secondary | ICD-10-CM

## 2024-07-18 NOTE — MAU Provider Note (Signed)
 " History     CSN: [id number]  Arrival date and time: 07/18/24 2219   Event Date/Time   First Provider Initiated Contact with Patient 07/18/24 2239      Chief Complaint  Patient presents with   Decreased Fetal Movement   HPI Katelyn Sawyer is a 24 y.o. G1P0000 at [redacted]w[redacted]d who presents with decreased fetal movement. Reports no movement today prior to arrival. Since being on monitor has felt increase in movement. Denies abdominal pain, vaginal bleeding, or LOF.   OB History     Gravida  1   Para  0   Term  0   Preterm  0   AB  0   Living  0      SAB  0   IAB  0   Ectopic  0   Multiple  0   Live Births  0           Past Medical History:  Diagnosis Date   Medical history non-contributory     Past Surgical History:  Procedure Laterality Date   NO PAST SURGERIES      Family History  Problem Relation Age of Onset   Healthy Mother    Healthy Father    Diabetes Neg Hx    Hypertension Neg Hx     Social History[1]  Allergies: Allergies[2]  No medications prior to admission.    Review of Systems  All other systems reviewed and are negative.  Physical Exam   Blood pressure 117/77, pulse 89, temperature 98 F (36.7 C), temperature source Oral, resp. rate 16, height 5' 3 (1.6 m), weight 75.3 kg, last menstrual period 10/27/2023, SpO2 98%.  Physical Exam Vitals and nursing note reviewed.  Constitutional:      General: She is not in acute distress.    Appearance: She is well-developed. She is not ill-appearing.  HENT:     Head: Normocephalic and atraumatic.  Eyes:     General: No scleral icterus.       Right eye: No discharge.        Left eye: No discharge.     Conjunctiva/sclera: Conjunctivae normal.  Pulmonary:     Effort: Pulmonary effort is normal. No respiratory distress.  Neurological:     General: No focal deficit present.     Mental Status: She is alert.  Psychiatric:        Mood and Affect: Mood normal.         Behavior: Behavior normal.    NST:  Baseline: 130 bpm, Variability: Good {> 6 bpm), Accelerations: Reactive, and Decelerations: Absent  Pt informed that the ultrasound is considered a limited OB ultrasound and is not intended to be a complete ultrasound exam.  Patient also informed that the ultrasound is not being completed with the intent of assessing for fetal or placental anomalies or any pelvic abnormalities.  Explained that the purpose of todays ultrasound is to assess for  presentation and AFI.  Patient acknowledges the purpose of the exam and the limitations of the study.   Cephalic. MVP 4.9     MAU Course  Procedures No results found for this or any previous visit (from the past 24 hours).  MDM   Assessment and Plan   1. Decreased fetal movements in third trimester, single or unspecified fetus   2. NST (non-stress test) reactive   3. [redacted] weeks gestation of pregnancy    -Reactive NST. Patient documented >10 movements during her NST -BSUS performed. Baby is  vertex & MVP 4.9 -Reviewed return precautions. Patient has f/u appointment on 1/12  Rocky Satterfield 07/19/2024, 1:24 PM      [1]  Social History Tobacco Use   Smoking status: Never  Vaping Use   Vaping status: Former   Substances: Nicotine, Flavoring  Substance Use Topics   Alcohol use: Not Currently   Drug use: Not Currently    Types: Marijuana    Comment: last used before preg  [2] No Known Allergies  "

## 2024-07-18 NOTE — MAU Note (Signed)
 MAU Triage Note: Katelyn Sawyer is a 24 y.o. at [redacted]w[redacted]d here in MAU reporting: DFM over the past couple of hours. Denies VB, abnormal discharge, or pain.  Patient complaint: DFM  Onset of complaint: see note above LMP: Patient's last menstrual period was 10/27/2023 (approximate).  Vitals:   07/18/24 2230  BP: 129/80  Pulse: 92  Resp: 16  Temp: 98 F (36.7 C)  SpO2: 98%    FHT:  Fetal Heart Rate Mode: External Baseline Rate (A): 145 bpm Lab orders placed from triage: N/A

## 2024-07-19 ENCOUNTER — Encounter (HOSPITAL_COMMUNITY): Payer: Self-pay | Admitting: Obstetrics and Gynecology

## 2024-07-20 ENCOUNTER — Encounter: Payer: Self-pay | Admitting: Obstetrics

## 2024-07-20 VITALS — BP 102/68 | HR 95 | Wt 167.4 lb

## 2024-07-20 DIAGNOSIS — R87610 Atypical squamous cells of undetermined significance on cytologic smear of cervix (ASC-US): Secondary | ICD-10-CM | POA: Diagnosis not present

## 2024-07-20 DIAGNOSIS — Z3A38 38 weeks gestation of pregnancy: Secondary | ICD-10-CM

## 2024-07-20 DIAGNOSIS — O98813 Other maternal infectious and parasitic diseases complicating pregnancy, third trimester: Secondary | ICD-10-CM

## 2024-07-20 DIAGNOSIS — A749 Chlamydial infection, unspecified: Secondary | ICD-10-CM | POA: Diagnosis not present

## 2024-07-20 DIAGNOSIS — Z3480 Encounter for supervision of other normal pregnancy, unspecified trimester: Secondary | ICD-10-CM

## 2024-07-20 NOTE — Progress Notes (Signed)
 Subjective:  Katelyn Sawyer is a 24 y.o. G1P0000 at [redacted]w[redacted]d being seen today for ongoing prenatal care.  She is currently monitored for the following issues for this low-risk pregnancy and has Supervision of other normal pregnancy, antepartum; Atypical squamous cells of undetermined significance (ASCUS) on Papanicolaou smear of cervix; and Chlamydia infection affecting pregnancy in third trimester on their problem list.  Patient reports occasional contractions.  Contractions: Not present. Vag. Bleeding: None.  Movement: Present. Denies leaking of fluid.   The following portions of the patient's history were reviewed and updated as appropriate: allergies, current medications, past family history, past medical history, past social history, past surgical history and problem list. Problem list updated.  Objective:   Vitals:   07/20/24 1134  BP: 102/68  Pulse: 95  Weight: 167 lb 6.4 oz (75.9 kg)    Fetal Status: Fetal Heart Rate (bpm): 145   Movement: Present     General:  Alert, oriented and cooperative. Patient is in no acute distress.  Skin: Skin is warm and dry. No rash noted.   Cardiovascular: Normal heart rate noted  Respiratory: Normal respiratory effort, no problems with respiration noted  Abdomen: Soft, gravid, appropriate for gestational age. Pain/Pressure: Present     Pelvic:  Cervical exam deferred        Extremities: Normal range of motion.  Edema: Trace (feet)  Mental Status: Normal mood and affect. Normal behavior. Normal judgment and thought content.   Urinalysis:      Assessment and Plan:  Pregnancy: G1P0000 at [redacted]w[redacted]d  1. Supervision of other normal pregnancy, antepartum (Primary)  2. Chlamydia infection affecting pregnancy in third trimester, treated on 07/15/2024 - needs TOC done next week  3. Atypical squamous cells of undetermined significance (ASCUS) on Papanicolaou smear of cervix    Term labor symptoms and general obstetric precautions including but  not limited to vaginal bleeding, contractions, leaking of fluid and fetal movement were reviewed in detail with the patient. Please refer to After Visit Summary for other counseling recommendations.   Return in about 1 week (around 07/27/2024) for ROB.   Rudy Carlin LABOR, MD 07/20/2024

## 2024-07-20 NOTE — Progress Notes (Signed)
 Pt presents for rob. Pt has no questions or concerns at this time.

## 2024-07-30 ENCOUNTER — Encounter: Payer: Self-pay | Admitting: Obstetrics and Gynecology

## 2024-07-30 ENCOUNTER — Ambulatory Visit: Payer: Self-pay | Admitting: Obstetrics and Gynecology

## 2024-07-30 VITALS — BP 129/78 | HR 96 | Wt 169.0 lb

## 2024-07-30 DIAGNOSIS — O98813 Other maternal infectious and parasitic diseases complicating pregnancy, third trimester: Secondary | ICD-10-CM

## 2024-07-30 DIAGNOSIS — Z3A39 39 weeks gestation of pregnancy: Secondary | ICD-10-CM

## 2024-07-30 DIAGNOSIS — A749 Chlamydial infection, unspecified: Secondary | ICD-10-CM | POA: Diagnosis not present

## 2024-07-30 DIAGNOSIS — Z3480 Encounter for supervision of other normal pregnancy, unspecified trimester: Secondary | ICD-10-CM

## 2024-07-30 MED ORDER — AZITHROMYCIN 500 MG PO TABS: 1000.0000 mg | ORAL_TABLET | Freq: Once | ORAL | 0 refills | Status: AC

## 2024-07-30 NOTE — Progress Notes (Signed)
 "  PRENATAL VISIT NOTE  Subjective:  Katelyn Sawyer is a 24 y.o. G1P0000 at [redacted]w[redacted]d being seen today for ongoing prenatal care.  She is currently monitored for the following issues for this low-risk pregnancy and has Supervision of other normal pregnancy, antepartum; Atypical squamous cells of undetermined significance (ASCUS) on Papanicolaou smear of cervix; and Chlamydia infection affecting pregnancy in third trimester on their problem list.  Patient reports no complaints.  Contractions: Irregular (cramping sensation, comes and goes quickly).  .  Movement: Present. Denies leaking of fluid.   The following portions of the patient's history were reviewed and updated as appropriate: allergies, current medications, past family history, past medical history, past social history, past surgical history and problem list.   Objective:   Vitals:   07/30/24 1343  BP: 129/78  Pulse: 96  Weight: 169 lb (76.7 kg)    Fetal Status:  Fetal Heart Rate (bpm): 136 Fundal Height: 38 cm Movement: Present    General: Alert, oriented and cooperative. Patient is in no acute distress.  Skin: Skin is warm and dry. No rash noted.   Cardiovascular: Normal heart rate noted  Respiratory: Normal respiratory effort, no problems with respiration noted  Abdomen: Soft, gravid, appropriate for gestational age.  Pain/Pressure: Present     Pelvic: Cervical exam deferred        Extremities: Normal range of motion.  Edema: Trace  Mental Status: Normal mood and affect. Normal behavior. Normal judgment and thought content.      07/13/2024    9:04 AM 05/19/2024   10:06 AM 02/05/2024    9:41 AM  Depression screen PHQ 2/9  Decreased Interest 0 0 2  Down, Depressed, Hopeless 0 0 1  PHQ - 2 Score 0 0 3  Altered sleeping 1 0 2  Tired, decreased energy 1 0 3  Change in appetite 0 0 1  Feeling bad or failure about yourself  0 0 0  Trouble concentrating 0 0 1  Moving slowly or fidgety/restless 0 0 0  Suicidal  thoughts 0 0 0  PHQ-9 Score 2 0 10   Difficult doing work/chores  Not difficult at all      Data saved with a previous flowsheet row definition        07/13/2024    9:05 AM 05/19/2024   10:07 AM 02/05/2024    9:42 AM 01/22/2024    9:46 AM  GAD 7 : Generalized Anxiety Score  Nervous, Anxious, on Edge 0  0  1  1   Control/stop worrying 0  0  0  0   Worry too much - different things 0  0  1  0   Trouble relaxing 0  0  1  0   Restless 0  0  0  0   Easily annoyed or irritable 0  0  2  1   Afraid - awful might happen 0  0  0  0   Total GAD 7 Score 0 0 5 2  Anxiety Difficulty  Not difficult at all       Data saved with a previous flowsheet row definition    Assessment and Plan:  Pregnancy: G1P0000 at [redacted]w[redacted]d 1. Supervision of other normal pregnancy, antepartum (Primary) Patient is doing well without complaints Peds list provided as she is still undecided Plan for IOL at 41 weeks  2. Chlamydia infection affecting pregnancy in third trimester Patient obtained treated on 07/14/24. Partner received positive results yesterday and treatment this morning  Patient admits to having intercourse a few days ago New Rx provided to the patient due to re-exposure.   Preterm labor symptoms and general obstetric precautions including but not limited to vaginal bleeding, contractions, leaking of fluid and fetal movement were reviewed in detail with the patient. Please refer to After Visit Summary for other counseling recommendations.   Return in about 1 week (around 08/06/2024) for in person, ROB, NST, AFI.  No future appointments.  Winton Felt, MD  "

## 2024-07-30 NOTE — Patient Instructions (Signed)

## 2024-08-03 ENCOUNTER — Telehealth (HOSPITAL_COMMUNITY): Payer: Self-pay | Admitting: *Deleted

## 2024-08-03 NOTE — Telephone Encounter (Signed)
 Preadmission screen

## 2024-08-04 ENCOUNTER — Telehealth (HOSPITAL_COMMUNITY): Payer: Self-pay | Admitting: *Deleted

## 2024-08-04 ENCOUNTER — Encounter (HOSPITAL_COMMUNITY): Payer: Self-pay | Admitting: *Deleted

## 2024-08-04 NOTE — Telephone Encounter (Signed)
 Preadmission screen

## 2024-08-06 ENCOUNTER — Ambulatory Visit: Admitting: *Deleted

## 2024-08-06 ENCOUNTER — Ambulatory Visit: Payer: Self-pay | Admitting: Obstetrics and Gynecology

## 2024-08-06 ENCOUNTER — Encounter: Payer: Self-pay | Admitting: Obstetrics and Gynecology

## 2024-08-06 ENCOUNTER — Ambulatory Visit: Payer: Self-pay

## 2024-08-06 VITALS — BP 124/78 | HR 77 | Wt 170.0 lb

## 2024-08-06 VITALS — BP 124/78 | HR 77 | Wt 170.2 lb

## 2024-08-06 DIAGNOSIS — O98813 Other maternal infectious and parasitic diseases complicating pregnancy, third trimester: Secondary | ICD-10-CM | POA: Diagnosis not present

## 2024-08-06 DIAGNOSIS — A749 Chlamydial infection, unspecified: Secondary | ICD-10-CM | POA: Diagnosis not present

## 2024-08-06 DIAGNOSIS — Z3A40 40 weeks gestation of pregnancy: Secondary | ICD-10-CM

## 2024-08-06 DIAGNOSIS — R87610 Atypical squamous cells of undetermined significance on cytologic smear of cervix (ASC-US): Secondary | ICD-10-CM

## 2024-08-06 DIAGNOSIS — O480 Post-term pregnancy: Secondary | ICD-10-CM

## 2024-08-06 DIAGNOSIS — Z3480 Encounter for supervision of other normal pregnancy, unspecified trimester: Secondary | ICD-10-CM

## 2024-08-06 NOTE — Progress Notes (Signed)
 Shelle Ramirez Salazar presents for NST and AFI at [redacted]w[redacted]d GA per antenatal testing guidelines. She reports good FM and no LOF. She reports light spotting this AM but no UC's. Fetal tracing reactive. Irregular UC's noted. Pt feeling UC's once monitors applied. She is uncomfortable but able to talk through them. AFI WNL. Largest pocket 2.6 cm. Total 6.5 cm. Fetal tracing and AFI reviewed by Dr. Nicholaus. Pt given labor, SROM, VB, and decreased FM precautions. Pt verbalized understanding.

## 2024-08-06 NOTE — Progress Notes (Signed)
 "  PRENATAL VISIT NOTE  Subjective:  Katelyn Sawyer is a 24 y.o. G1P0000 at [redacted]w[redacted]d being seen today for ongoing prenatal care.  She is currently monitored for the following issues for this low-risk pregnancy and has Supervision of other normal pregnancy, antepartum; Atypical squamous cells of undetermined significance (ASCUS) on Papanicolaou smear of cervix; and Chlamydia infection affecting pregnancy in third trimester on their problem list.  Patient reports tiny amount of brown spotting.  Contractions: Irregular.  .  Movement: Present. Denies leaking of fluid.   The following portions of the patient's history were reviewed and updated as appropriate: allergies, current medications, past family history, past medical history, past social history, past surgical history and problem list.   Objective:   Vitals:   08/06/24 1132  BP: 124/78  Pulse: 77  Weight: 170 lb (77.1 kg)    Fetal Status:  Fetal Heart Rate (bpm): 125   Movement: Present    General: Alert, oriented and cooperative. Patient is in no acute distress.  Skin: Skin is warm and dry. No rash noted.   Cardiovascular: Normal heart rate noted  Respiratory: Normal respiratory effort, no problems with respiration noted  Abdomen: Soft, gravid, appropriate for gestational age.  Pain/Pressure: Absent     Pelvic: Cervical exam deferred        Extremities: Normal range of motion.     Mental Status: Normal mood and affect. Normal behavior. Normal judgment and thought content.      07/13/2024    9:04 AM 05/19/2024   10:06 AM 02/05/2024    9:41 AM  Depression screen PHQ 2/9  Decreased Interest 0 0 2  Down, Depressed, Hopeless 0 0 1  PHQ - 2 Score 0 0 3  Altered sleeping 1 0 2  Tired, decreased energy 1 0 3  Change in appetite 0 0 1  Feeling bad or failure about yourself  0 0 0  Trouble concentrating 0 0 1  Moving slowly or fidgety/restless 0 0 0  Suicidal thoughts 0 0 0  PHQ-9 Score 2 0 10   Difficult doing  work/chores  Not difficult at all      Data saved with a previous flowsheet row definition        07/13/2024    9:05 AM 05/19/2024   10:07 AM 02/05/2024    9:42 AM 01/22/2024    9:46 AM  GAD 7 : Generalized Anxiety Score  Nervous, Anxious, on Edge 0  0  1  1   Control/stop worrying 0  0  0  0   Worry too much - different things 0  0  1  0   Trouble relaxing 0  0  1  0   Restless 0  0  0  0   Easily annoyed or irritable 0  0  2  1   Afraid - awful might happen 0  0  0  0   Total GAD 7 Score 0 0 5 2  Anxiety Difficulty  Not difficult at all       Data saved with a previous flowsheet row definition    Assessment and Plan:  Pregnancy: G1P0000 at [redacted]w[redacted]d  1. Supervision of other normal pregnancy, antepartum (Primary) Provided anticipatory guidance for induction NST reactive, AFI 6.5 cm, max pocket 2.7 cm  2. Atypical squamous cells of undetermined significance (ASCUS) on Papanicolaou smear of cervix  3. Chlamydia infection affecting pregnancy in third trimester Pt originally dx on 07/14/24, treated at that point Had  new exposure around 07/28/24 due to patient's partner not being treated until 07/30/24 -will need TOC after delivery -inform peds of infant exposure at delivery  4. [redacted] weeks gestation of pregnancy   Term labor symptoms and general obstetric precautions including but not limited to vaginal bleeding, contractions, leaking of fluid and fetal movement were reviewed in detail with the patient. Please refer to After Visit Summary for other counseling recommendations.   Return in about 1 month (around 09/05/2024) for Followup.  Future Appointments  Date Time Provider Department Center  08/09/2024  7:00 AM [facility] SCHED ROOM [facility] None  09/03/2024  3:50 PM Katelyn Kieth BROCKS, MD [facility] None    Katelyn CHRISTELLA Moats, MD  "

## 2024-08-09 ENCOUNTER — Inpatient Hospital Stay (HOSPITAL_COMMUNITY)
Admission: AD | Admit: 2024-08-09 | Discharge: 2024-08-12 | DRG: 806 | Disposition: A | Attending: Family Medicine | Admitting: Family Medicine

## 2024-08-09 ENCOUNTER — Inpatient Hospital Stay (HOSPITAL_COMMUNITY): Admission: AD | Admit: 2024-08-09 | Payer: Self-pay | Source: Home / Self Care | Admitting: Family Medicine

## 2024-08-09 ENCOUNTER — Inpatient Hospital Stay (HOSPITAL_COMMUNITY)

## 2024-08-09 ENCOUNTER — Encounter (HOSPITAL_COMMUNITY): Payer: Self-pay | Admitting: Family Medicine

## 2024-08-09 ENCOUNTER — Other Ambulatory Visit: Payer: Self-pay

## 2024-08-09 DIAGNOSIS — Z87891 Personal history of nicotine dependence: Secondary | ICD-10-CM

## 2024-08-09 DIAGNOSIS — A568 Sexually transmitted chlamydial infection of other sites: Secondary | ICD-10-CM | POA: Diagnosis present

## 2024-08-09 DIAGNOSIS — O480 Post-term pregnancy: Principal | ICD-10-CM | POA: Diagnosis present

## 2024-08-09 DIAGNOSIS — Z3A41 41 weeks gestation of pregnancy: Secondary | ICD-10-CM

## 2024-08-09 DIAGNOSIS — A749 Chlamydial infection, unspecified: Secondary | ICD-10-CM | POA: Diagnosis present

## 2024-08-09 DIAGNOSIS — O9832 Other infections with a predominantly sexual mode of transmission complicating childbirth: Secondary | ICD-10-CM | POA: Diagnosis present

## 2024-08-09 LAB — URINALYSIS, ROUTINE W REFLEX MICROSCOPIC
Bacteria, UA: NONE SEEN
Bilirubin Urine: NEGATIVE
Glucose, UA: NEGATIVE mg/dL
Hgb urine dipstick: NEGATIVE
Ketones, ur: NEGATIVE mg/dL
Nitrite: NEGATIVE
Protein, ur: NEGATIVE mg/dL
Specific Gravity, Urine: 1.01 (ref 1.005–1.030)
pH: 7 (ref 5.0–8.0)

## 2024-08-09 LAB — CBC
HCT: 37.2 % (ref 36.0–46.0)
Hemoglobin: 12.8 g/dL (ref 12.0–15.0)
MCH: 29.7 pg (ref 26.0–34.0)
MCHC: 34.4 g/dL (ref 30.0–36.0)
MCV: 86.3 fL (ref 80.0–100.0)
Platelets: 196 10*3/uL (ref 150–400)
RBC: 4.31 MIL/uL (ref 3.87–5.11)
RDW: 14.4 % (ref 11.5–15.5)
WBC: 10.6 10*3/uL — ABNORMAL HIGH (ref 4.0–10.5)
nRBC: 0 % (ref 0.0–0.2)

## 2024-08-09 LAB — TYPE AND SCREEN
ABO/RH(D): O POS
Antibody Screen: NEGATIVE

## 2024-08-09 LAB — POCT FERN TEST: POCT Fern Test: POSITIVE

## 2024-08-09 MED ORDER — OXYTOCIN BOLUS FROM INFUSION
333.0000 mL | Freq: Once | INTRAVENOUS | Status: AC
  Administered 2024-08-10: 333 mL via INTRAVENOUS

## 2024-08-09 MED ORDER — FLEET ENEMA RE ENEM: 1.0000 | ENEMA | RECTAL | Status: DC | PRN

## 2024-08-09 MED ORDER — SOD CITRATE-CITRIC ACID 500-334 MG/5ML PO SOLN: 30.0000 mL | ORAL | Status: DC | PRN

## 2024-08-09 MED ORDER — LACTATED RINGERS IV SOLN
500.0000 mL | INTRAVENOUS | Status: AC | PRN
  Administered 2024-08-09: 500 mL via INTRAVENOUS

## 2024-08-09 MED ORDER — ONDANSETRON HCL 4 MG/2ML IJ SOLN: 4.0000 mg | Freq: Four times a day (QID) | INTRAMUSCULAR | Status: DC | PRN

## 2024-08-09 MED ORDER — OXYTOCIN-SODIUM CHLORIDE 30-0.9 UT/500ML-% IV SOLN
2.5000 [IU]/h | INTRAVENOUS | Status: DC
  Administered 2024-08-10: 2.5 [IU]/h via INTRAVENOUS
  Filled 2024-08-09: qty 500

## 2024-08-09 MED ORDER — LACTATED RINGERS IV SOLN: INTRAVENOUS | Status: AC

## 2024-08-09 MED ORDER — OXYCODONE-ACETAMINOPHEN 5-325 MG PO TABS: 2.0000 | ORAL_TABLET | ORAL | Status: DC | PRN

## 2024-08-09 MED ORDER — LIDOCAINE HCL (PF) 1 % IJ SOLN: 30.0000 mL | INTRAMUSCULAR | Status: DC | PRN

## 2024-08-09 MED ORDER — ACETAMINOPHEN 325 MG PO TABS
650.0000 mg | ORAL_TABLET | ORAL | Status: DC | PRN
  Administered 2024-08-11: 650 mg via ORAL
  Filled 2024-08-09: qty 2

## 2024-08-09 MED ORDER — OXYCODONE-ACETAMINOPHEN 5-325 MG PO TABS: 1.0000 | ORAL_TABLET | ORAL | Status: DC | PRN

## 2024-08-09 NOTE — H&P (Signed)
 OBSTETRIC ADMISSION HISTORY AND PHYSICAL  Katelyn Sawyer is 24 y.o. G1P0000 with IUP at [redacted]w[redacted]d 08/02/2024, by Last Menstrual Period presenting for SROM and latent labor. She received her prenatal care at [facility]   ROS (+) FM, ctx, LOF (-) VB   Prenatal History/Complications NURSING  PROVIDER  Office Location [facility] Dating by LMP C/W US  at [redacted]w[redacted]d  [facility] Model Traditional Anatomy U/S Normal  Initiated care at  [facility]                 Language  English               LAB RESULTS   Support Person  Katelyn Sawyer NIPS: LR XY AFP: neg      NT/IT (FT only)        Carrier Screen Horizon: normal  Rhogam  O/Positive/-- (07/16 1041) A1C/GTT Early HgbA1C:  Third trimester 2 hr GTT: normal  Flu Vaccine  05/19/24      TDaP Vaccine  05/05/2024 Blood Type O/Positive/-- (07/16 1041)  RSV Vaccine No  Antibody Negative (07/16 1041)  COVID Vaccine  Yes Rubella 1.03 (07/16 1041)  Feeding Plan breast RPR Non Reactive (11/11 0836)  Contraception PILLS HBsAg Negative (07/16 1041)  Circumcision No HIV Non Reactive (11/11 0836)  Pediatrician   Undecided HCVAb Non Reactive (07/16 1041)  Prenatal Classes        BTL Consent   Pap ASCUS 2024 - repeat PP  BTL Pre-payment   GC/CT Initial:   36wks:    VBAC Consent   GBS   For PCN allergy, check sensitivities   BRx Optimized? [X]  yes   [ ]  no      DME Rx [X]  BP cuff [ ]  Weight Scale Waterbirth  [ ]  Class [ ]  Consent [ ]  CNM visit  PHQ9 & GAD7 [X]  new OB [X]  28 weeks  [ x ] 36 weeks Induction  [ ]  Orders Entered [ ] Foley Y/N   OB History  Gravida Para Term Preterm AB Living  1 0 0 0 0 0  SAB IAB Ectopic Multiple Live Births  0 0 0 0 0    # Outcome Date GA Lbr Len/2nd Weight Sex Type Anes PTL Lv  1 Current            Patient Active Problem List   Diagnosis Date Noted   Chlamydia infection affecting pregnancy in third trimester 07/14/2024   Supervision of other normal pregnancy, antepartum 01/22/2024   Atypical squamous cells of undetermined  significance (ASCUS) on Papanicolaou smear of cervix 08/18/2022    Past Medical History: Past Medical History:  Diagnosis Date   Medical history non-contributory     Past Surgical History: Past Surgical History:  Procedure Laterality Date   NO PAST SURGERIES      Social History Social History   Socioeconomic History   Marital status: Single    Spouse name: Not on file   Number of children: Not on file   Years of education: Not on file   Highest education level: Not on file  Occupational History   Not on file  Tobacco Use   Smoking status: Never   Smokeless tobacco: Not on file  Vaping Use   Vaping status: Former   Substances: Nicotine, Flavoring  Substance and Sexual Activity   Alcohol use: Not Currently   Drug use: Not Currently    Types: Marijuana    Comment: last used before preg   Sexual activity: Not Currently  Other Topics  Concern   Not on file  Social History Narrative   Not on file   Social Drivers of Health   Tobacco Use: Unknown (08/09/2024)   Patient History    Smoking Tobacco Use: Never    Smokeless Tobacco Use: Unknown    Passive Exposure: Not on file  Financial Resource Strain: Not on file  Food Insecurity: Not on file  Transportation Needs: Not on file  Physical Activity: Not on file  Stress: Not on file  Social Connections: Not on file  Depression (PHQ2-9): Low Risk (07/13/2024)   Depression (PHQ2-9)    PHQ-2 Score: 2  Alcohol Screen: Not on file  Housing: Not on file  Utilities: Not on file  Health Literacy: Not on file    Family History: Family History  Problem Relation Age of Onset   Healthy Mother    Healthy Father    Diabetes Neg Hx    Hypertension Neg Hx     Allergies: Allergies[1]  Medications Prior to Admission  Medication Sig Dispense Refill Last Dose/Taking   Prenatal Vit-Fe Phos-FA-Omega (VITAFOL  GUMMIES) 3.33-0.333-34.8 MG CHEW Chew 1 tablet by mouth daily. 90 tablet 5 Past Week     Review of Systems  All  systems reviewed and negative except as stated in HPI  PHYSICAL EXAM Blood pressure 125/84, pulse (!) 105, temperature 98.1 F (36.7 C), temperature source Oral, resp. rate 20, height 5' 3 (1.6 m), weight 77.1 kg, last menstrual period 10/27/2023, SpO2 99%. General appearance: alert, cooperative, and mild distress Lungs: respirations nonlabored Heart: slight tachycardia, appropriate Abdomen: gravid  Fetal monitoringBaseline: 120 bpm, Variability: Good {> 6 bpm), Accelerations: Reactive, and Decelerations: Absent Uterine activity: present by observation, frequency cannot be determined by toco d/t high baseline and ctx off graph  Dilation: 3 Effacement (%): 80 Station: -3 Exam by:: Katelyn freund, RN Presentation: cephalic   Prenatal labs: ABO, Rh: --/--/PENDING (02/01 2140) Antibody: PENDING (02/01 2140) Rubella: 1.03 (07/16 1041) RPR: Non Reactive (11/11 0836)  HBsAg: Negative (07/16 1041)  HIV: Non Reactive (11/11 0836)   Lab Results  Component Value Date   GBS Negative 07/13/2024    Anatomy US : WNL  Immunization History  Administered Date(s) Administered   DTaP 09/05/2001, 10/31/2001, 01/01/2002, 10/20/2002, 11/05/2005   HIB (PRP-OMP) 09/05/2001, 10/31/2001, 01/01/2002, 07/14/2002   HPV 9-valent 10/06/2014   HPV Quadrivalent 10/09/2013, 02/09/2014   Hepatitis A 11/05/2005, 05/05/2012   Hepatitis B 06-20-2001, 08/08/2001, 04/03/2002   IPV 09/05/2001, 11/30/2001, 10/20/2002, 11/05/2005   Influenza Nasal 05/05/2012   Influenza, Seasonal, Injecte, Preservative Fre 05/19/2024   Influenza,Quad,Nasal, Live 10/06/2014   Influenza,inj,Quad PF,6+ Mos 05/15/2019   MMR 07/14/2002   MMRV 11/05/2005   Meningococcal Conjugate 10/09/2013, 08/29/2017   Pneumococcal-Unspecified 09/05/2001, 10/31/2001, 01/01/2002, 07/14/2002   Tdap 05/05/2012, 05/05/2024   Varicella 07/14/2002    Prenatal Transfer Tool  Maternal Diabetes: No Genetic Screening: Normal Maternal  Ultrasounds/Referrals: Normal Fetal Ultrasounds or other Referrals:  Referred to Materal Fetal Medicine  Maternal Substance Abuse:  No Significant Maternal Medications:  None Significant Maternal Lab Results: Group B Strep negative Number of Prenatal Visits:greater than 3 verified prenatal visits Maternal Vaccinations:TDap and Flu Other Comments:  Chlamydia was treated, however reexposure d/t partner not treated    Results for orders placed or performed during the hospital encounter of 08/09/24 (from the past 24 hours)  CBC   Collection Time: 08/09/24  9:40 PM  Result Value Ref Range   WBC 10.6 (H) 4.0 - 10.5 K/uL   RBC 4.31 3.87 - 5.11  MIL/uL   Hemoglobin 12.8 12.0 - 15.0 g/dL   HCT 62.7 63.9 - 53.9 %   MCV 86.3 80.0 - 100.0 fL   MCH 29.7 26.0 - 34.0 pg   MCHC 34.4 30.0 - 36.0 g/dL   RDW 85.5 88.4 - 84.4 %   Platelets 196 150 - 400 K/uL   nRBC 0.0 0.0 - 0.2 %  Type and screen [facility]   Collection Time: 08/09/24  9:40 PM  Result Value Ref Range   ABO/RH(D) PENDING    Antibody Screen PENDING    Sample Expiration      08/12/2024,2359 Performed at [facility], [address] [place], [place] [address]   Odetta Test   Collection Time: 08/09/24  9:44 PM  Result Value Ref Range   POCT Fern Test Positive = ruptured amniotic membanes     Patient Active Problem List   Diagnosis Date Noted   Chlamydia infection affecting pregnancy in third trimester 07/14/2024   Supervision of other normal pregnancy, antepartum 01/22/2024   Atypical squamous cells of undetermined significance (ASCUS) on Papanicolaou smear of cervix 08/18/2022    ASSESSMENT & PLAN Izel Mickaela Starlin is 24 y.o. G1P0000 with IUP at [redacted]w[redacted]d 08/02/2024, by Last Menstrual Period admitted for SROM in latent labor.  Sono at 24.1: normal anatomy, cephalic presentation, anterior placenta, EFW 599g, (44%)  #Labor: SROM in latent phase. Expectant for now and consider augmentation PRN.   #Pain: Per patient preference, encourage ambulation #FWB: Cat 1  #CT Rx sent, however re-exposure due to partner not treated - strong recommend erythro ointment for infant - Needs TOC after delivery  #GBS status:  negative #Feeding: Breastmilk  #Reproductive Life planning: Progesterone only pills #Circ:  no   Barabara Maier, DO FM-OB Fellow [facility] for [employer]      [1] No Known Allergies

## 2024-08-09 NOTE — MAU Note (Addendum)
 Katelyn Sawyer is a 24 y.o. at [redacted]w[redacted]d here in MAU reporting:   Pt states she started having ctxs every 5 min for the past hour. Pt also reports LOF. She first noticed a trickle around 12pm but thought it was urine. Pt also reports some pink spotting. +Fm.   IOL- PLANNED FOR TODAY 08/09/24.  Does not plan for epidural.  GBS neg.   Denies a headache or blurry vision or epigastric pain.   Pain score: 6/10 lower abdominal ctxs.   Vitals:   08/09/24 2124 08/09/24 2133  BP:  (!) 121/95  Pulse:  (!) 118  Resp: 20 20  Temp: 98.1 F (36.7 C)   SpO2: 99% 99%     FHT: 126  Lab orders placed from triage: fERN SLIDE.

## 2024-08-10 ENCOUNTER — Encounter (HOSPITAL_COMMUNITY): Payer: Self-pay

## 2024-08-10 ENCOUNTER — Inpatient Hospital Stay (HOSPITAL_COMMUNITY): Admitting: Anesthesiology

## 2024-08-10 LAB — COMPREHENSIVE METABOLIC PANEL WITH GFR
ALT: 15 U/L (ref 0–44)
AST: 25 U/L (ref 15–41)
Albumin: 3.7 g/dL (ref 3.5–5.0)
Alkaline Phosphatase: 234 U/L — ABNORMAL HIGH (ref 38–126)
Anion gap: 14 (ref 5–15)
BUN: 7 mg/dL (ref 6–20)
CO2: 20 mmol/L — ABNORMAL LOW (ref 22–32)
Calcium: 9.4 mg/dL (ref 8.9–10.3)
Chloride: 102 mmol/L (ref 98–111)
Creatinine, Ser: 0.65 mg/dL (ref 0.44–1.00)
GFR, Estimated: >60 mL/min
Glucose, Bld: 94 mg/dL (ref 70–99)
Potassium: 4.2 mmol/L (ref 3.5–5.1)
Sodium: 135 mmol/L (ref 135–145)
Total Bilirubin: 0.3 mg/dL (ref 0.0–1.2)
Total Protein: 6.7 g/dL (ref 6.5–8.1)

## 2024-08-10 LAB — SYPHILIS: RPR W/REFLEX TO RPR TITER AND TREPONEMAL ANTIBODIES, TRADITIONAL SCREENING AND DIAGNOSIS ALGORITHM: RPR Ser Ql: NONREACTIVE

## 2024-08-10 MED ORDER — DIPHENHYDRAMINE HCL 50 MG/ML IJ SOLN: 12.5000 mg | INTRAMUSCULAR | Status: DC | PRN

## 2024-08-10 MED ORDER — FENTANYL CITRATE (PF) 100 MCG/2ML IJ SOLN
100.0000 ug | INTRAMUSCULAR | Status: DC | PRN
  Administered 2024-08-10 (×3): 100 ug via INTRAVENOUS
  Filled 2024-08-10 (×3): qty 2

## 2024-08-10 MED ORDER — BENZOCAINE-MENTHOL 20-0.5 % EX AERO
1.0000 | INHALATION_SPRAY | CUTANEOUS | Status: DC | PRN
  Administered 2024-08-11: 1 via TOPICAL
  Filled 2024-08-10: qty 56

## 2024-08-10 MED ORDER — PHENYLEPHRINE 80 MCG/ML (10ML) SYRINGE FOR IV PUSH (FOR BLOOD PRESSURE SUPPORT): 80.0000 ug | PREFILLED_SYRINGE | INTRAVENOUS | Status: DC | PRN

## 2024-08-10 MED ORDER — FENTANYL-BUPIVACAINE-NACL 0.5-0.125-0.9 MG/250ML-% EP SOLN
12.0000 mL/h | EPIDURAL | Status: DC | PRN
  Administered 2024-08-10: 12 mL/h via EPIDURAL
  Filled 2024-08-10: qty 250

## 2024-08-10 MED ORDER — TRANEXAMIC ACID-NACL 1000-0.7 MG/100ML-% IV SOLN
INTRAVENOUS | Status: AC
  Filled 2024-08-10: qty 100

## 2024-08-10 MED ORDER — IBUPROFEN 600 MG PO TABS
600.0000 mg | ORAL_TABLET | Freq: Four times a day (QID) | ORAL | Status: DC
  Administered 2024-08-11 – 2024-08-12 (×6): 600 mg via ORAL
  Filled 2024-08-10 (×6): qty 1

## 2024-08-10 MED ORDER — EPHEDRINE 5 MG/ML INJ: 10.0000 mg | INTRAVENOUS | Status: DC | PRN

## 2024-08-10 MED ORDER — OXYTOCIN-SODIUM CHLORIDE 30-0.9 UT/500ML-% IV SOLN
1.0000 m[IU]/min | INTRAVENOUS | Status: DC
  Administered 2024-08-10: 2 m[IU]/min via INTRAVENOUS

## 2024-08-10 MED ORDER — DIPHENHYDRAMINE HCL 50 MG/ML IJ SOLN
25.0000 mg | Freq: Once | INTRAMUSCULAR | Status: AC
  Administered 2024-08-10: 25 mg via INTRAVENOUS
  Filled 2024-08-10: qty 1

## 2024-08-10 MED ORDER — LIDOCAINE HCL (PF) 1 % IJ SOLN
INTRAMUSCULAR | Status: DC | PRN
  Administered 2024-08-10: 8 mL via EPIDURAL

## 2024-08-10 MED ORDER — FENTANYL-BUPIVACAINE-NACL 0.5-0.125-0.9 MG/250ML-% EP SOLN: 12.0000 mL/h | EPIDURAL | Status: DC | PRN

## 2024-08-10 MED ORDER — TERBUTALINE SULFATE 1 MG/ML IJ SOLN: 0.2500 mg | Freq: Once | INTRAMUSCULAR | Status: DC | PRN

## 2024-08-10 MED ORDER — LACTATED RINGERS IV SOLN: 500.0000 mL | Freq: Once | INTRAVENOUS | Status: DC

## 2024-08-10 NOTE — Progress Notes (Signed)
 Patient ID: Katelyn Sawyer, female   DOB: 2000/12/13, 24 y.o.   MRN: [id number]  Blood pressure 135/88, pulse 74, temperature 98.2 F (36.8 C), temperature source Oral, resp. rate 20, height 5' 3 (1.6 m), weight 77.1 kg, last menstrual period 10/27/2023, SpO2 99%.  FHT: baseline 140/moderate variability/+accels/-decels Toco: ctx  q1-55min  Dilation: 5 Effacement (%): 70, 80 Station: -2 Presentation: Vertex Exam by:: Ellieana Dolecki  Seen at bedside to meet patient. Visibly uncomfortable, holding nitrous mask to face throughout conversation. Feeling the urge to push and is bearing down with contractions. SVE unchanged, cervical swelling noted. Has been unchanged since 1am. Discussed starting pitocin  to progress labor. Also discussed epidural, patient is concerned about long-term back pain. Will give benadryl  to help with swelling. Patient ultimately undecided on epidural or pitocin . RN and provider left the room to give patient and family time to discuss plan. Will re-address.  Charlie DELENA Courts, MD

## 2024-08-10 NOTE — Anesthesia Procedure Notes (Signed)
 Epidural Patient location during procedure: OB Start time: 08/10/2024 11:15 AM End time: 08/10/2024 11:22 AM  Staffing Anesthesiologist: Mallory Manus, MD  Preanesthetic Checklist Completed: patient identified, IV checked, site marked, risks and benefits discussed, surgical consent, monitors and equipment checked, pre-op evaluation and timeout performed  Epidural Patient position: sitting Prep: DuraPrep and site prepped and draped Patient monitoring: continuous pulse ox and blood pressure Approach: midline Location: L4-L5 Injection technique: LOR air  Needle:  Needle type: Tuohy  Needle gauge: 17 G Needle length: 9 cm and 9 Needle insertion depth: 6 cm Catheter type: closed end flexible Catheter size: 19 Gauge Catheter at skin depth: 12 cm Test dose: negative  Assessment Events: blood not aspirated, no cerebrospinal fluid, injection not painful, no injection resistance, no paresthesia and negative IV test

## 2024-08-10 NOTE — Plan of Care (Signed)

## 2024-08-10 NOTE — Anesthesia Preprocedure Evaluation (Signed)
"                                    Anesthesia Evaluation  Patient identified by MRN, date of birth, ID band Patient awake    Reviewed: Allergy & Precautions, H&P , NPO status , Patient's Chart, lab work & pertinent test results  Airway Mallampati: III  TM Distance: >3 FB Neck ROM: Full    Dental no notable dental hx. (+) Teeth Intact, Dental Advisory Given   Pulmonary neg pulmonary ROS   Pulmonary exam normal breath sounds clear to auscultation       Cardiovascular Exercise Tolerance: Good negative cardio ROS Normal cardiovascular exam Rhythm:Regular Rate:Normal     Neuro/Psych negative neurological ROS  negative psych ROS   GI/Hepatic negative GI ROS, Neg liver ROS,,,  Endo/Other  negative endocrine ROS    Renal/GU negative Renal ROS  negative genitourinary   Musculoskeletal negative musculoskeletal ROS (+)    Abdominal   Peds negative pediatric ROS (+)  Hematology negative hematology ROS (+)   Anesthesia Other Findings   Reproductive/Obstetrics (+) Pregnancy                              Anesthesia Physical Anesthesia Plan  ASA: 2  Anesthesia Plan: Epidural   Post-op Pain Management:    Induction:   PONV Risk Score and Plan: Treatment may vary due to age or medical condition  Airway Management Planned: Natural Airway  Additional Equipment: Fetal Monitoring  Intra-op Plan:   Post-operative Plan:   Informed Consent: I have reviewed the patients History and Physical, chart, labs and discussed the procedure including the risks, benefits and alternatives for the proposed anesthesia with the patient or authorized representative who has indicated his/her understanding and acceptance.       Plan Discussed with: Anesthesiologist  Anesthesia Plan Comments:          Anesthesia Quick Evaluation  "

## 2024-08-10 NOTE — Progress Notes (Signed)
 Labor Progress Note Janeva Katharyn Schauer is a 24 y.o. G1P0000 at [redacted]w[redacted]d presented for SROM  S:  Working through contractions with breathing  O:  BP 127/78   Pulse 72   Temp 98.5 F (36.9 C)   Resp 20   Ht 5' 3 (1.6 m)   Wt 77.1 kg   LMP 10/27/2023 (Approximate)   SpO2 99%   BMI 30.11 kg/m   EFM: baseline 125 bpm/ minimal-moderate variability/ 15x15 accels/ variable decels  Toco/IUPC: 1-4 SVE: Dilation: 5 Effacement (%): 90 Station: -2 Presentation: Vertex Exam by:: Arlean Pizza, RN Pitocin : 0 mu/min  A/P: 24 y.o. G1P0000 [redacted]w[redacted]d  1. Labor: SROM in latent phase, RBA of AROM of forebag d/w patient, patient requests fentanyl  before AROM.  2. FWB: Cat 2, reassuring overall 3. Pain: Coping well, working through contractions with breathing 4. GBS neg   Anticipate SVB.  Camie DELENA Rote, CNM 5:29 AM

## 2024-08-11 ENCOUNTER — Encounter (HOSPITAL_COMMUNITY): Payer: Self-pay | Admitting: Family Medicine

## 2024-08-11 MED ORDER — TETANUS-DIPHTH-ACELL PERTUSSIS 5-2-15.5 LF-MCG/0.5 IM SUSP: 0.5000 mL | Freq: Once | INTRAMUSCULAR | Status: DC

## 2024-08-11 MED ORDER — PRENATAL MULTIVITAMIN CH
1.0000 | ORAL_TABLET | Freq: Every day | ORAL | Status: DC
  Administered 2024-08-11: 1 via ORAL
  Filled 2024-08-11: qty 1

## 2024-08-11 MED ORDER — WITCH HAZEL-GLYCERIN EX PADS: 1.0000 | MEDICATED_PAD | CUTANEOUS | Status: DC | PRN

## 2024-08-11 MED ORDER — MAGNESIUM HYDROXIDE 400 MG/5ML PO SUSP: 30.0000 mL | Freq: Every day | ORAL | Status: DC | PRN

## 2024-08-11 MED ORDER — INFLUENZA VIRUS VACC SPLIT PF (FLUZONE) 0.5 ML IM SUSY: 0.5000 mL | PREFILLED_SYRINGE | INTRAMUSCULAR | Status: DC

## 2024-08-11 MED ORDER — DIBUCAINE (PERIANAL) 1 % EX OINT: 1.0000 | TOPICAL_OINTMENT | CUTANEOUS | Status: DC | PRN

## 2024-08-11 MED ORDER — COCONUT OIL OIL: 1.0000 | TOPICAL_OIL | Status: DC | PRN

## 2024-08-11 MED ORDER — MEASLES, MUMPS & RUBELLA VAC ~~LOC~~ SUSR: 0.5000 mL | Freq: Once | SUBCUTANEOUS | Status: DC

## 2024-08-11 MED ORDER — SENNOSIDES-DOCUSATE SODIUM 8.6-50 MG PO TABS
2.0000 | ORAL_TABLET | Freq: Every evening | ORAL | Status: DC | PRN
  Administered 2024-08-11: 2 via ORAL
  Filled 2024-08-11: qty 2

## 2024-08-11 MED ORDER — ONDANSETRON HCL 4 MG PO TABS: 4.0000 mg | ORAL_TABLET | ORAL | Status: DC | PRN

## 2024-08-11 MED ORDER — MEDROXYPROGESTERONE ACETATE 150 MG/ML IM SUSP: 150.0000 mg | INTRAMUSCULAR | Status: DC | PRN

## 2024-08-11 MED ORDER — AZITHROMYCIN 500 MG PO TABS
1000.0000 mg | ORAL_TABLET | Freq: Once | ORAL | Status: AC
  Administered 2024-08-11: 1000 mg via ORAL
  Filled 2024-08-11: qty 2

## 2024-08-11 MED ORDER — ACETAMINOPHEN 500 MG PO TABS
1000.0000 mg | ORAL_TABLET | Freq: Four times a day (QID) | ORAL | Status: DC
  Administered 2024-08-11 – 2024-08-12 (×5): 1000 mg via ORAL
  Filled 2024-08-11 (×5): qty 2

## 2024-08-11 MED ORDER — DIPHENHYDRAMINE HCL 25 MG PO CAPS: 25.0000 mg | ORAL_CAPSULE | Freq: Four times a day (QID) | ORAL | Status: DC | PRN

## 2024-08-11 MED ORDER — ONDANSETRON HCL 4 MG/2ML IJ SOLN: 4.0000 mg | INTRAMUSCULAR | Status: DC | PRN

## 2024-08-11 MED ORDER — SIMETHICONE 80 MG PO CHEW: 80.0000 mg | CHEWABLE_TABLET | ORAL | Status: DC | PRN

## 2024-08-11 NOTE — Progress Notes (Signed)
 POSTPARTUM PROGRESS NOTE Postpartum Day 1  Subjective: Katelyn Sawyer is a 24 y.o. G1P1001 s/p NVD at [redacted]w[redacted]d.  She reports she is doing well. No acute events overnight. She denies any problems with ambulating, voiding or po intake. Denies nausea or vomiting.  Pain is well controlled.  Lochia is appropriate.  Objective: Blood pressure 106/69, pulse 97, temperature 98.4 F (36.9 C), temperature source Oral, resp. rate 18, height 5' 3 (1.6 m), weight 77.1 kg, last menstrual period 10/27/2023, SpO2 98%, unknown if currently breastfeeding.  Physical Exam:  General: alert, cooperative and no distress Heart:regular rate Resp: nonlabored Uterine Fundus: firm, appropriately tender Extremities:  none edema Skin: warm, dry  Recent Labs    08/09/24 2140  HGB 12.8  HCT 37.2    Assessment/Plan: Katelyn Sawyer is a 24 y.o. G1P1001 s/p NVDat [redacted]w[redacted]d   PPD#1 - tolerating po, will encounter other milestones today - continue routine postpartum care  Chlamydia in pregnancy Treated in /3\ then re-exposed because partner did not get treated - azithro 1g once ordered - recommend partner be treated during 6wk pelvic rest period  Feeding: breast Contraception: POP   Dispo: Plan for discharge PPD2.  LOS: 2 days   Katelyn Maier, DO FM-OB Fellow Center for [employer]

## 2024-08-11 NOTE — Lactation Note (Signed)
 This note was copied from a baby's chart. Lactation Consultation Note  Patient Name: Katelyn Sawyer Date: 08/11/2024 Age:24 hours Reason for consult: Initial assessment;1st time breastfeeding;Term P1, Per MOB, infant is breastfeeding well no concerns regarding breastfeeding, infant is feeding 10-15 minutes most feedings . Infant had one void and three stools since birth. MOB will continue to breastfeed infant by cues, on demand, 8-12 times within 24 hours, skin to skin. MOB knows to call for latch assistance if needed. LC discussed the importance of maternal rest, meals and hydration. MOB informed LC she has DEBP at home. MOB was  made aware of O/P services, breastfeeding support groups, community resources, and our phone # for post-discharge questions.    Maternal Data Does the patient have breastfeeding experience prior to this delivery?: No  Feeding Mother's Current Feeding Choice: Breast Milk  LATCH Score  LC did not observe latch due to infant recently breastfeeding at 01:10 pm for 24 minutes,                   Lactation Tools Discussed/Used    Interventions Interventions: Breast feeding basics reviewed;Skin to skin;Education;CDC Guidelines for Breast Pump Cleaning;CDC milk storage guidelines;Guidelines for Milk Supply and Pumping Schedule Handout  Discharge Pump: DEBP;Personal  Consult Status Consult Status: Follow-up Date: 08/12/24 Follow-up type: In-patient    Katelyn Sawyer 08/11/2024, 3:06 PM

## 2024-08-12 ENCOUNTER — Other Ambulatory Visit (HOSPITAL_COMMUNITY): Payer: Self-pay

## 2024-08-12 MED ORDER — IBUPROFEN 600 MG PO TABS
600.0000 mg | ORAL_TABLET | Freq: Four times a day (QID) | ORAL | 0 refills | Status: AC
  Filled 2024-08-12: qty 30, 8d supply, fill #0

## 2024-08-12 MED ORDER — ACETAMINOPHEN 500 MG PO TABS
1000.0000 mg | ORAL_TABLET | Freq: Four times a day (QID) | ORAL | 0 refills | Status: AC
  Filled 2024-08-12: qty 30, 4d supply, fill #0

## 2024-08-12 MED ORDER — NORETHINDRONE 0.35 MG PO TABS
1.0000 | ORAL_TABLET | Freq: Every day | ORAL | 0 refills | Status: AC
  Filled 2024-08-12: qty 84, 84d supply, fill #0

## 2024-08-12 NOTE — Lactation Note (Signed)
 This note was copied from a baby's chart. Lactation Consultation Note  Patient Name: Katelyn Sawyer Unijb'd Date: 08/12/2024 Age:24 hours Reason for consult: Infant weight loss;Follow-up assessment;1st time breastfeeding;Primapara;Term (6 % weight loss, Double Photo tx , OA incompatibility and  + DAT) LC reviewed the doc flow sheets with mom and the baby has breast fed 14 times in 36 hours, 2 wets and 11 stools, and per mom have been green and go back and forth to brown and black. Serum bili has increased this am - 10.1  LC praised mom for the consistent breast feeding.  LC explained the reason for setting up the DEBP and extra pumping to start after the next feeding is to enhance the fatty milk coming in quicker. #18 F fits well.  LC also recommended with the hand pump she could pre-pump with the hand pump. To enhance the flow every feeding to make it easier for the baby.  Maternal Data Has patient been taught Hand Expression?: Yes (small drops) Does the patient have breastfeeding experience prior to this delivery?: No  Feeding Mother's Current Feeding Choice: Breast Milk  LATCH Score - 6-8    Lactation Tools Discussed/Used Tools: Pump;Flanges Flange Size: 18 Breast pump type: Double-Electric Breast Pump;Manual Pump Education: Setup, frequency, and cleaning;Milk Storage Reason for Pumping: due to + DAT , OA incompatibility, Double Photo tx  Interventions Interventions: Breast feeding basics reviewed;Hand pump;DEBP;Education;LC Services brochure;CDC milk storage guidelines;CDC Guidelines for Breast Pump Cleaning  Discharge Pump: DEBP;Personal;Manual  Consult Status Consult Status: Follow-up Date: 08/13/24 Follow-up type: In-patient    Rollene Caldron Zuhayr Deeney 08/12/2024, 11:45 AM

## 2024-08-12 NOTE — Progress Notes (Signed)
 CSW met MOB at bedside to due a positive depression screening completed during pregnancy. CSW met MOB at bedside to complete a mental health assessment and offer support. CSW entered the room, introduced herself and acknowledged that FOB was present holding the infant. MOB gave CSW verbal permission to speak about anything while he was present. CSW explained her role and the reason for the visit. MOB was polite, easy to engage, receptive to meeting with CSW, and appeared forthcoming.  CSW congratulated MOB and FOB on the arrival of the infant. CSW asked MOB about her mental health history. MOB reported experiencing depression at  younger age; however she was never been diagnosed. MOB reported completing several screenings during the initial OB visits however she feels her mental health is stable. MOB denied being prescribed medication or participating in therapy for support. CSW asked MOB how does she cope with her mental health. MOB reported coping skills which included talking with FOB and Journaling. MOB denied needing therapy resources at this time. MOB reported her supports as FOB and her mom. CSW provided education regarding the baby blues period vs. perinatal mood disorders, discussed treatment and gave resources for mental health follow up if concerns arise.  CSW recommends self-evaluation during the postpartum time period using the New Mom Checklist from Postpartum Progress and encouraged MOB to contact a medical professional if symptoms are noted at any time. CSW assessed for safety with MOB SI/HI; MOB denied SI/HI. CSW did not assess for DV; FOB was present.  CSW asked MOB has she selected a pediatrician for the infant's follow up visits; MOB said [employer].  MOB reported having all essential items for the infant including a carseat, bassinet and crib for safe sleeping. CSW provided review of Sudden Infant Death Syndrome (SIDS) precautions.   CSW identifies no further need for  intervention and no barriers to discharge at this time.  Rosina Molt, [place] Clinical Social Worker [phone]

## 2024-08-12 NOTE — Progress Notes (Signed)
 Post Partum Day 1  Subjective: no complaints, up ad lib, voiding, tolerating PO, and + flatus  Objective: Blood pressure 117/75, pulse 79, temperature 98.1 F (36.7 C), temperature source Oral, resp. rate 16, height 5' 3 (1.6 m), weight 77.1 kg, last menstrual period 10/27/2023, SpO2 100%, unknown if currently breastfeeding.  Physical Exam:  General: alert Lochia: appropriate Uterine Fundus: firm DVT Evaluation: No evidence of DVT seen on physical exam.  Recent Labs    08/09/24 2140  HGB 12.8  HCT 37.2    Assessment/Plan: Discharge home Patient plans to use POP birth control pills Will f/u for 6 weeks appointment with [facility]   LOS: 3 days   Edsel CINDERELLA Generous, Student-MidWife 08/12/2024, 7:15 AM

## 2024-08-12 NOTE — Patient Instructions (Signed)

## 2024-08-13 NOTE — Lactation Note (Signed)
 This note was copied from a baby's chart. Lactation Consultation Note  Patient Name: Boy Datra Clary Unijb'd Date: 08/13/2024 Age:24 hours Reason for consult: Follow-up assessment;Hyperbilirubinemia (Repeat Bili 10.2)   Maternal Data Has patient been taught Hand Expression?: Yes Does the patient have breastfeeding experience prior to this delivery?: No  Feeding Mother's Current Feeding Choice: Breast Milk    Lactation Tools Discussed/Used Tools: Pump;Flanges Flange Size: 18 Breast pump type: Double-Electric Breast Pump;Manual Pump Education: Setup, frequency, and cleaning;Milk Storage Pumped volume:  (per mom has been pre-pumping, but not post pumping)  Interventions Interventions: Breast feeding basics reviewed;Skin to skin;Pre-pump if needed;Support pillows;Hand pump;Shells;DEBP;Education;LC Services brochure;CDC milk storage guidelines;CDC Guidelines for Breast Pump Cleaning  Discharge Discharge Education: Engorgement and breast care;Warning signs for feeding baby Pump: DEBP;Manual  Consult Status Consult Status: Complete Date: 08/13/24 Follow-up type: In-patient    Rollene Caldron Lezlie Ritchey 08/13/2024, 2:41 PM

## 2024-08-13 NOTE — Lactation Note (Signed)
 This note was copied from a baby's chart. Lactation Consultation Note  Patient Name: Katelyn Sawyer Unijb'd Date: 08/13/2024 Age:24 hours Reason for consult: Follow-up assessment;Primapara;1st time breastfeeding;Term;Infant weight loss;Hyperbilirubinemia (9 % weight loss) Photo tx D/C this am at 7;30 am and in 8 hours the plan is a skin Bilirubin.  LC reviewed and updated the doc flow sheets per mom.  Per mom mentioned she is hearing more swallows and her breast are heavier today.  LC reviewed engorgement prevention and tx  Per mom has only pre pumped prior to the feeding and has not post pumped.   Maternal Data Has patient been taught Hand Expression?: Yes Does the patient have breastfeeding experience prior to this delivery?: No  Feeding Mother's Current Feeding Choice: Breast Milk  LATCH Score - 8     Lactation Tools Discussed/Used Tools: Pump;Flanges Flange Size: 18 Breast pump type: Double-Electric Breast Pump;Manual Pump Education: Setup, frequency, and cleaning;Milk Storage Pumped volume:  (per mom has been pre-pumping, but not post pumping)  Interventions Interventions: Breast feeding basics reviewed;Skin to skin;Pre-pump if needed;Support pillows;Hand pump;Shells;DEBP;Education;LC Services brochure;CDC milk storage guidelines;CDC Guidelines for Breast Pump Cleaning  Discharge Discharge Education: Engorgement and breast care;Warning signs for feeding baby Pump: DEBP;Manual;Personal  Consult Status Consult Status: Follow-up Date: 08/13/24 Follow-up type: In-patient    Rollene Caldron Kirstie Larsen 08/13/2024, 11:36 AM

## 2024-09-03 ENCOUNTER — Ambulatory Visit: Payer: Self-pay | Admitting: Obstetrics and Gynecology

## 2024-09-22 ENCOUNTER — Ambulatory Visit: Admitting: Physician Assistant
